# Patient Record
Sex: Female | Born: 1995 | Race: White | Hispanic: No | Marital: Single | Smoking: Current every day smoker
Health system: Southern US, Community
[De-identification: ages and names within clinical notes are randomized; demographics above are authoritative.]

## PROBLEM LIST (undated history)

## (undated) ENCOUNTER — Inpatient Hospital Stay (HOSPITAL_COMMUNITY): Payer: Self-pay

## (undated) DIAGNOSIS — F1910 Other psychoactive substance abuse, uncomplicated: Secondary | ICD-10-CM

## (undated) DIAGNOSIS — M419 Scoliosis, unspecified: Secondary | ICD-10-CM

## (undated) HISTORY — PX: BACK SURGERY: SHX140

## (undated) HISTORY — DX: Other psychoactive substance abuse, uncomplicated: F19.10

## (undated) HISTORY — PX: ADENOIDECTOMY: SUR15

---
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
## 2015-02-09 ENCOUNTER — Encounter (HOSPITAL_COMMUNITY): Payer: Self-pay | Admitting: Advanced Practice Midwife

## 2015-02-09 ENCOUNTER — Inpatient Hospital Stay (HOSPITAL_COMMUNITY)
Admission: AD | Admit: 2015-02-09 | Discharge: 2015-02-09 | Disposition: A | Discharge status: Home / Self Care | Payer: Medicaid Other | Source: Ambulatory Visit | Attending: Obstetrics and Gynecology | Admitting: Obstetrics and Gynecology

## 2015-02-09 DIAGNOSIS — O99332 Smoking (tobacco) complicating pregnancy, second trimester: Secondary | ICD-10-CM | POA: Diagnosis not present

## 2015-02-09 DIAGNOSIS — O26892 Other specified pregnancy related conditions, second trimester: Secondary | ICD-10-CM | POA: Insufficient documentation

## 2015-02-09 DIAGNOSIS — K5903 Drug induced constipation: Secondary | ICD-10-CM | POA: Diagnosis not present

## 2015-02-09 DIAGNOSIS — T402X5A Adverse effect of other opioids, initial encounter: Secondary | ICD-10-CM | POA: Diagnosis not present

## 2015-02-09 DIAGNOSIS — O99322 Drug use complicating pregnancy, second trimester: Secondary | ICD-10-CM

## 2015-02-09 DIAGNOSIS — F17210 Nicotine dependence, cigarettes, uncomplicated: Secondary | ICD-10-CM | POA: Diagnosis not present

## 2015-02-09 DIAGNOSIS — R1030 Lower abdominal pain, unspecified: Secondary | ICD-10-CM | POA: Diagnosis not present

## 2015-02-09 DIAGNOSIS — R102 Pelvic and perineal pain: Secondary | ICD-10-CM

## 2015-02-09 DIAGNOSIS — F1120 Opioid dependence, uncomplicated: Secondary | ICD-10-CM | POA: Diagnosis not present

## 2015-02-09 DIAGNOSIS — O26899 Other specified pregnancy related conditions, unspecified trimester: Secondary | ICD-10-CM

## 2015-02-09 DIAGNOSIS — O0932 Supervision of pregnancy with insufficient antenatal care, second trimester: Secondary | ICD-10-CM | POA: Insufficient documentation

## 2015-02-09 DIAGNOSIS — Z3A18 18 weeks gestation of pregnancy: Secondary | ICD-10-CM | POA: Diagnosis not present

## 2015-02-09 DIAGNOSIS — N949 Unspecified condition associated with female genital organs and menstrual cycle: Secondary | ICD-10-CM

## 2015-02-09 DIAGNOSIS — O9989 Other specified diseases and conditions complicating pregnancy, childbirth and the puerperium: Secondary | ICD-10-CM

## 2015-02-09 HISTORY — DX: Other psychoactive substance abuse, uncomplicated: F19.10

## 2015-02-09 LAB — URINALYSIS, ROUTINE W REFLEX MICROSCOPIC
Bilirubin Urine: NEGATIVE
Glucose, UA: NEGATIVE mg/dL
HGB URINE DIPSTICK: NEGATIVE
Ketones, ur: 15 mg/dL — AB
LEUKOCYTES UA: NEGATIVE
NITRITE: NEGATIVE
PROTEIN: NEGATIVE mg/dL
Specific Gravity, Urine: 1.025 (ref 1.005–1.030)
UROBILINOGEN UA: 2 mg/dL — AB (ref 0.0–1.0)
pH: 6.5 (ref 5.0–8.0)

## 2015-02-09 LAB — WET PREP, GENITAL
Clue Cells Wet Prep HPF POC: NONE SEEN
TRICH WET PREP: NONE SEEN
WBC WET PREP: NONE SEEN
YEAST WET PREP: NONE SEEN

## 2015-02-09 MED ORDER — POLYETHYLENE GLYCOL 3350 17 G PO PACK: 17.0000 g | PACK | Freq: Every day | ORAL | Status: DC | PRN

## 2015-02-09 NOTE — Progress Notes (Signed)
Marlou Porch CNM in to discuss lab results and d/c plan with pt. Written and verbal d/c instructions given and understanding voiced

## 2015-02-09 NOTE — Discharge Instructions (Signed)
Abdominal Pain During Pregnancy Abdominal pain is common in pregnancy. Most of the time, it does not cause harm. There are many causes of abdominal pain. Some causes are more serious than others. Some of the causes of abdominal pain in pregnancy are easily diagnosed. Occasionally, the diagnosis takes time to understand. Other times, the cause is not determined. Abdominal pain can be a sign that something is very wrong with the pregnancy, or the pain may have nothing to do with the pregnancy at all. For this reason, always tell your health care provider if you have any abdominal discomfort. HOME CARE INSTRUCTIONS  Monitor your abdominal pain for any changes. The following actions may help to alleviate any discomfort you are experiencing:  Do not have sexual intercourse or put anything in your vagina until your symptoms go away completely.  Get plenty of rest until your pain improves.  Drink clear fluids if you feel nauseous. Avoid solid food as long as you are uncomfortable or nauseous.  Only take over-the-counter or prescription medicine as directed by your health care provider.  Keep all follow-up appointments with your health care provider. SEEK IMMEDIATE MEDICAL CARE IF:  You are bleeding, leaking fluid, or passing tissue from the vagina.  You have increasing pain or cramping.  You have persistent vomiting.  You have painful or bloody urination.  You have a fever.  You notice a decrease in your baby's movements.  You have extreme weakness or feel faint.  You have shortness of breath, with or without abdominal pain.  You develop a severe headache with abdominal pain.  You have abnormal vaginal discharge with abdominal pain.  You have persistent diarrhea.  You have abdominal pain that continues even after rest, or gets worse. MAKE SURE YOU:   Understand these instructions.  Will watch your condition.  Will get help right away if you are not doing well or get worse.     This information is not intended to replace advice given to you by your health care provider. Make sure you discuss any questions you have with your health care provider.   Document Released: 03/20/2005 Document Revised: 01/08/2013 Document Reviewed: 10/17/2012 Elsevier Interactive Patient Education 2016 [employer].   How a Baby Grows During Pregnancy Pregnancy begins when a female's sperm enters a female's egg (fertilization). This happens in one of the tubes (fallopian tubes) that connect the ovaries to the womb (uterus). The fertilized egg is called an embryo until it reaches 10 weeks. From 10 weeks until birth, it is called a fetus. The fertilized egg moves down the fallopian tube to the uterus. Then it implants into the lining of the uterus and begins to grow. The developing fetus receives oxygen and nutrients through the pregnant woman's bloodstream and the tissues that grow (placenta) to support the fetus. The placenta is the life support system for the fetus. It provides nutrition and removes waste. Learning as much as you can about your pregnancy and how your baby is developing can help you enjoy the experience. It can also make you aware of when there might be a problem and when to ask questions. HOW LONG DOES A TYPICAL PREGNANCY LAST? A pregnancy usually lasts 280 days, or about 40 weeks. Pregnancy is divided into three trimesters:  First trimester: 0-13 weeks.  Second trimester: 14-27 weeks.  Third trimester: 28-40 weeks. The day when your baby is considered ready to be born (full term) is your estimated date of delivery. HOW DOES MY BABY DEVELOP MONTH  BY MONTH? First month  The fertilized egg attaches to the inside of the uterus.  Some cells will form the placenta. Others will form the fetus.  The arms, legs, brain, spinal cord, lungs, and heart begin to develop.  At the end of the first month, the heart begins to beat. Second month  The bones, inner ear, eyelids,  hands, and feet form.  The genitals develop.  By the end of 8 weeks, all major organs are developing. Third month  All of the internal organs are forming.  Teeth develop below the gums.  Bones and muscles begin to grow. The spine can flex.  The skin is transparent.  Fingernails and toenails begin to form.  Arms and legs continue to grow longer, and hands and feet develop.  The fetus is about 3 in (7.6 cm) long. Fourth month  The placenta is completely formed.  The external sex organs, neck, outer ear, eyebrows, eyelids, and fingernails are formed.  The fetus can hear, swallow, and move its arms and legs.  The kidneys begin to produce urine.  The skin is covered with a white waxy coating (vernix) and very fine hair (lanugo). Fifth month  The fetus moves around more and can be felt for the first time (quickening).  The fetus starts to sleep and wake up and may begin to suck its finger.  The nails grow to the end of the fingers.  The organ in the digestive system that makes bile (gallbladder) functions and helps to digest the nutrients.  If your baby is a girl, eggs are present in her ovaries. If your baby is a boy, testicles start to move down into his scrotum. Sixth month  The lungs are formed, but the fetus is not yet able to breathe.  The eyes open. The brain continues to develop.  Your baby has fingerprints and toe prints. Your baby's hair grows thicker.  At the end of the second trimester, the fetus is about 9 in (22.9 cm) long. Seventh month  The fetus kicks and stretches.  The eyes are developed enough to sense changes in light.  The hands can make a grasping motion.  The fetus responds to sound. Eighth month  All organs and body systems are fully developed and functioning.  Bones harden and taste buds develop. The fetus may hiccup.  Certain areas of the brain are still developing. The skull remains soft. Ninth month  The fetus gains about   lb (0.23 kg) each week.  The lungs are fully developed.  Patterns of sleep develop.  The fetus's head typically moves into a head-down position (vertex) in the uterus to prepare for birth. If the buttocks move into a vertex position instead, the baby is breech.  The fetus weighs 6-9 lbs (2.72-4.08 kg) and is 19-20 in (48.26-50.8 cm) long. WHAT CAN I DO TO HAVE A HEALTHY PREGNANCY AND HELP MY BABY DEVELOP? Eating and Drinking  Eat a healthy diet.  Talk with your health care provider to make sure that you are getting the nutrients that you and your baby need.  Visit www.BuildDNA.es to learn about creating a healthy diet.  Gain a healthy amount of weight during pregnancy as advised by your health care provider. This is usually 25-35 pounds. You may need to:  Gain more if you were underweight before getting pregnant or if you are pregnant with more than one baby.  Gain less if you were overweight or obese when you got pregnant. Medicines and Vitamins  Take prenatal vitamins as directed by your health care provider. These include vitamins such as folic acid, iron, calcium, and vitamin D. They are important for healthy development.  Take medicines only as directed by your health care provider. Read labels and ask a pharmacist or your health care provider whether over-the-counter medicines, supplements, and prescription drugs are safe to take during pregnancy. Activities  Be physically active as advised by your health care provider. Ask your health care provider to recommend activities that are safe for you to do, such as walking or swimming.  Do not participate in strenuous or extreme sports. Lifestyle  Do not drink alcohol.  Do not use any tobacco products, including cigarettes, chewing tobacco, or electronic cigarettes. If you need help quitting, ask your health care provider.  Do not use illegal drugs. Safety  Avoid exposure to mercury, lead, or other heavy metals. Ask  your health care provider about common sources of these heavy metals.  Avoid listeria infection during pregnancy. Follow these precautions:  Do not eat soft cheeses or deli meats.  Do not eat hot dogs unless they have been warmed up to the point of steaming, such as in the microwave oven.  Do not drink unpasteurized milk.  Avoid toxoplasmosis infection during pregnancy. Follow these precautions:  Do not change your cat's litter box, if you have a cat. Ask someone else to do this for you.  Wear gardening gloves while working in the yard. General Instructions  Keep all follow-up visits as directed by your health care provider. This is important. This includes prenatal care and screening tests.  Manage any chronic health conditions. Work closely with your health care provider to keep conditions, such as diabetes, under control. HOW DO I KNOW IF MY BABY IS DEVELOPING WELL? At each prenatal visit, your health care provider will do several different tests to check on your health and keep track of your baby's development. These include:  Fundal height.  Your health care provider will measure your growing belly from top to bottom using a tape measure.  Your health care provider will also feel your belly to determine your baby's position.  Heartbeat.  An ultrasound in the first trimester can confirm pregnancy and show a heartbeat, depending on how far along you are.  Your health care provider will check your baby's heart rate at every prenatal visit.  As you get closer to your delivery date, you may have regular fetal heart rate monitoring to make sure that your baby is not in distress.  Second trimester ultrasound.  This ultrasound checks your baby's development. It also indicates your baby's gender. WHAT SHOULD I DO IF I HAVE CONCERNS ABOUT MY BABY'S DEVELOPMENT? Always talk with your health care provider about any concerns that you may have.   This information is not intended to  replace advice given to you by your health care provider. Make sure you discuss any questions you have with your health care provider.   Document Released: 09/06/2007 Document Revised: 12/09/2014 Document Reviewed: 08/27/2013 Elsevier Interactive Patient Education [employer].

## 2015-02-09 NOTE — MAU Provider Note (Signed)
Chief Complaint: Abdominal Pain  First Provider Initiated Contact with Patient 02/09/15 1552     SUBJECTIVE HPI: Christine Ramsey is a 19 y.o. G1P0 at [redacted]w[redacted]d by LMP/10 weeks [place] who presents to Maternity Admissions reporting low abd cramping each morning x a few weeks. Hasn't not started [facility] yet. Plans to go to [place] when she gets medicaid. Needs pregnancy verification letter.  Hx polysubstance abuse. On Methadone. Involuntarily committed in September 2016 for methadone, heroin, cocaine, Benzos. Goes to [facility] clinic. States she has been compliant w/ Methadone and not using other drugs since that hospitalization. Very concerned about baby's safety and states she is motivated to stay clean.  Location: mid low abd Quality: cramping Severity: moderate pain when it occurs. No pain now. Duration: weeks Context: upon waking up Timing: constant Modifying factors: worse in the morning before methadone and w/ mvmt. Resolves after taking methadone. Associated signs and symptoms: pos for nausea, frequency (throughout pregnancy), constipation (chronic since starting Methadone). Neg for fever, chills, vomiting, dysuria, urgency, hematuria, VB, vaginal discharge, LOF.   Past Medical History  Diagnosis Date  . Scoliosis   . Drug abuse   . Scoliosis    OB History  Gravida Para Term Preterm AB SAB TAB Ectopic Multiple Living  1             # Outcome Date GA Lbr Len/2nd Weight Sex Delivery Anes PTL Lv  1 Current              Past Surgical History  Procedure Laterality Date  . Back surgery    . Adenoidectomy     Social History   Social History  . Marital Status: Single    Spouse Name: N/A  . Number of Children: N/A  . Years of Education: N/A   Occupational History  . Not on file.   Social History Main Topics  . Smoking status: Current Every Day Smoker -- 0.25 packs/day    Types: Cigarettes  . Smokeless tobacco: Never Used  . Alcohol Use: No  . Drug Use: Yes    Special: Heroin     Comment: xanax and methadone. 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 Sexual Activity: No   Other Topics Concern  . Not on file   Social History Narrative   No current facility-administered medications on file prior to encounter.   Current Outpatient Prescriptions on File Prior to Encounter  Medication Sig Dispense Refill  . acetaminophen (TYLENOL) 500 MG tablet Take 1,000 mg by mouth every 6 (six) hours as needed for moderate pain or headache (pain).     . methadone (DOLOPHINE) 10 MG/ML solution Take 55 mg by mouth daily.     . clindamycin (CLEOCIN) 150 MG capsule Take 3 capsules (450 mg total) by mouth 3 (three) times daily. 63 capsule 0  . metroNIDAZOLE (FLAGYL) 500 MG tablet Take 1 tablet (500 mg total) by mouth 2 (two) times daily. 14 tablet 0   No Known Allergies  I have reviewed the past Medical Hx, Surgical Hx, Social Hx, Allergies and Medications.   Review of Systems  Constitutional: Negative for chills and fatigue.  Gastrointestinal: Positive for nausea, abdominal pain and constipation. Negative for vomiting, diarrhea, blood in stool and abdominal distention.  Genitourinary: Positive for frequency. Negative for dysuria, urgency, hematuria, flank pain, vaginal bleeding, vaginal discharge and pelvic pain.  Musculoskeletal: Negative for back pain.    OBJECTIVE Patient Vitals for the past 24 hrs:  BP Temp Pulse  Resp Height Weight  02/09/15 1558 (!) 109/43 mmHg - 79 18 - -  02/09/15 1450 121/62 mmHg 98 F (36.7 C) 81 18 5\' 7"  (1.702 m) 128 lb 6.4 oz (58.242 kg)   Constitutional: Well-developed, well-nourished, slender female in no acute distress.  Cardiovascular: normal rate Respiratory: normal rate and effort.  GI: Abd soft, non-tender, gravid appropriate for gestational age. Pos BS x 4 MS: Extremities nontender, no edema, normal ROM. No track marks. Neurologic: Alert and oriented x 4.  GU: Neg CVAT.  PELVIC EXAM: NEFG, physiologic  discharge, no blood noted, cervix long and closed. Uterus 18-week size, no adnexal tenderness or masses. No CMT.  FHR 130 by doppler.   LAB RESULTS Results for orders placed or performed during the hospital encounter of 02/09/15 (from the past 24 hour(s))  Urinalysis, Routine w reflex microscopic (not at [facility])     Status: Abnormal   Collection Time: 02/09/15  2:55 PM  Result Value Ref Range   Color, Urine YELLOW YELLOW   APPearance HAZY (A) CLEAR   Specific Gravity, Urine 1.025 1.005 - 1.030   pH 6.5 5.0 - 8.0   Glucose, UA NEGATIVE NEGATIVE mg/dL   Hgb urine dipstick NEGATIVE NEGATIVE   Bilirubin Urine NEGATIVE NEGATIVE   Ketones, ur 15 (A) NEGATIVE mg/dL   Protein, ur NEGATIVE NEGATIVE mg/dL   Urobilinogen, UA 2.0 (H) 0.0 - 1.0 mg/dL   Nitrite NEGATIVE NEGATIVE   Leukocytes, UA NEGATIVE NEGATIVE  Wet prep, genital     Status: None   Collection Time: 02/09/15  3:27 PM  Result Value Ref Range   Yeast Wet Prep HPF POC NONE SEEN NONE SEEN   Trich, Wet Prep NONE SEEN NONE SEEN   Clue Cells Wet Prep HPF POC NONE SEEN NONE SEEN   WBC, Wet Prep HPF POC NONE SEEN NONE SEEN    IMAGING No results found.  MAU COURSE UA, Wet prep, GC/Chlamydia, drugs of abuse screen.  MDM -19 year old female 18 weeks 2 days gestation with morning low abdominal pain consistent with round ligament pain/normal discomforts of pregnancy. No evidence of preterm labor. -Constipation likely related to patient's methadone therapy. Has been using Senokot as needed. Instructed to increase fluids and may add MiraLAX as needed. -Patient with history of polysubstance abuse. States she is very motivated to comply with methadone therapy. Will check drugs of abuse screen to determine if she is still using her 1 concurrent with methadone use.  ASSESSMENT 1. Pain of round ligament affecting pregnancy, antepartum   2. Methadone maintenance treatment affecting pregnancy in second trimester ([place])   3. No prenatal care in  current pregnancy in second trimester   4. Constipation due to pain medication therapy     PLAN Discharge home in stable condition. Preterm labor Precautions. Pregnancy verification letter given. Over-the-counter prenatal vitamin. MiraLAX as needed for constipation. Neonatal abstinence syndrome nurse contact information given.     Follow-up Information    Follow up with [facility].   Why:  Start prenatal care   Contact information:   [place] [address] [phone]       Follow up with [place].   Why:  As needed in emergencies   Contact information:   [address] [phone] [place] [phone]       Medication List    STOP taking these medications        clindamycin 150 MG  capsule  Commonly known as:  CLEOCIN     metroNIDAZOLE 500 MG tablet  Commonly known as:  FLAGYL      TAKE these medications        acetaminophen 500 MG tablet  Commonly known as:  TYLENOL  Take 1,000 mg by mouth every 6 (six) hours as needed for moderate pain or headache (pain).     methadone 10 MG/ML solution  Commonly known as:  DOLOPHINE  Take 55 mg by mouth daily.     polyethylene glycol packet  Commonly known as:  MIRALAX / GLYCOLAX  Take 17 g by mouth daily as needed.     senna 8.6 MG tablet  Commonly known as:  SENOKOT  Take 2 tablets by mouth daily as needed for constipation.         [place], [place] 02/09/2015  4:23 PM

## 2015-02-09 NOTE — MAU Note (Addendum)
Every morning when i wake up i having cramping in lower abd. I take methadone and after taking that the pain is better each day. No bleeding or LOF. Have not started [facility] yet.  Plan to go to [place] and last there in March. Awaiting medicaid

## 2015-02-10 LAB — GC/CHLAMYDIA PROBE AMP (~~LOC~~) NOT AT ARMC
Chlamydia: NEGATIVE
NEISSERIA GONORRHEA: NEGATIVE

## 2015-02-11 ENCOUNTER — Encounter (HOSPITAL_COMMUNITY): Payer: Self-pay

## 2015-02-11 NOTE — Progress Notes (Signed)
Patient scheduled for NICU NAS tour on 03/18/15 at 1:30 when pregnancy is closer to viability.

## 2015-02-24 ENCOUNTER — Ambulatory Visit (INDEPENDENT_AMBULATORY_CARE_PROVIDER_SITE_OTHER): Payer: Self-pay | Admitting: Family Medicine

## 2015-02-24 ENCOUNTER — Encounter: Payer: Self-pay | Admitting: Family Medicine

## 2015-02-24 VITALS — BP 107/64 | HR 67 | Temp 97.5°F | Wt 137.9 lb

## 2015-02-24 DIAGNOSIS — F1920 Other psychoactive substance dependence, uncomplicated: Secondary | ICD-10-CM

## 2015-02-24 DIAGNOSIS — O99332 Smoking (tobacco) complicating pregnancy, second trimester: Secondary | ICD-10-CM

## 2015-02-24 DIAGNOSIS — F1120 Opioid dependence, uncomplicated: Secondary | ICD-10-CM

## 2015-02-24 DIAGNOSIS — O26899 Other specified pregnancy related conditions, unspecified trimester: Secondary | ICD-10-CM

## 2015-02-24 DIAGNOSIS — O0992 Supervision of high risk pregnancy, unspecified, second trimester: Secondary | ICD-10-CM

## 2015-02-24 DIAGNOSIS — F1190 Opioid use, unspecified, uncomplicated: Secondary | ICD-10-CM

## 2015-02-24 DIAGNOSIS — Z113 Encounter for screening for infections with a predominantly sexual mode of transmission: Secondary | ICD-10-CM

## 2015-02-24 DIAGNOSIS — J029 Acute pharyngitis, unspecified: Secondary | ICD-10-CM

## 2015-02-24 DIAGNOSIS — F1994 Other psychoactive substance use, unspecified with psychoactive substance-induced mood disorder: Secondary | ICD-10-CM

## 2015-02-24 DIAGNOSIS — O99320 Drug use complicating pregnancy, unspecified trimester: Secondary | ICD-10-CM

## 2015-02-24 DIAGNOSIS — Z6791 Unspecified blood type, Rh negative: Secondary | ICD-10-CM | POA: Insufficient documentation

## 2015-02-24 DIAGNOSIS — F332 Major depressive disorder, recurrent severe without psychotic features: Secondary | ICD-10-CM

## 2015-02-24 DIAGNOSIS — F1910 Other psychoactive substance abuse, uncomplicated: Secondary | ICD-10-CM

## 2015-02-24 DIAGNOSIS — O360120 Maternal care for anti-D [Rh] antibodies, second trimester, not applicable or unspecified: Secondary | ICD-10-CM

## 2015-02-24 DIAGNOSIS — O99322 Drug use complicating pregnancy, second trimester: Secondary | ICD-10-CM

## 2015-02-24 DIAGNOSIS — O0990 Supervision of high risk pregnancy, unspecified, unspecified trimester: Secondary | ICD-10-CM | POA: Insufficient documentation

## 2015-02-24 DIAGNOSIS — O99330 Smoking (tobacco) complicating pregnancy, unspecified trimester: Secondary | ICD-10-CM | POA: Insufficient documentation

## 2015-02-24 HISTORY — DX: Opioid dependence, uncomplicated: F11.20

## 2015-02-24 LAB — POCT URINALYSIS DIP (DEVICE)
BILIRUBIN URINE: NEGATIVE
Glucose, UA: NEGATIVE mg/dL
Ketones, ur: NEGATIVE mg/dL
LEUKOCYTES UA: NEGATIVE
Nitrite: NEGATIVE
Protein, ur: NEGATIVE mg/dL
SPECIFIC GRAVITY, URINE: 1.02 (ref 1.005–1.030)
UROBILINOGEN UA: 4 mg/dL — AB (ref 0.0–1.0)
pH: 6.5 (ref 5.0–8.0)

## 2015-02-24 MED ORDER — NICOTINE 7 MG/24HR TD PT24: 7.0000 mg | MEDICATED_PATCH | Freq: Every day | TRANSDERMAL | Status: DC

## 2015-02-24 MED ORDER — AMOXICILLIN 500 MG PO CAPS: 500.0000 mg | ORAL_CAPSULE | Freq: Three times a day (TID) | ORAL | Status: DC

## 2015-02-24 MED ORDER — AMOXICILLIN 500 MG PO CAPS: 500.0000 mg | ORAL_CAPSULE | Freq: Two times a day (BID) | ORAL | Status: AC

## 2015-02-24 MED ORDER — NICOTINE POLACRILEX 2 MG MT LOZG: 2.0000 mg | LOZENGE | OROMUCOSAL | Status: DC | PRN

## 2015-02-24 NOTE — Patient Instructions (Signed)

## 2015-02-24 NOTE — Progress Notes (Signed)
Subjective:  Christine Ramsey is a 19 y.o. G1P0000 at [redacted]w[redacted]d being seen today for ongoing prenatal care.  She is currently monitored for the following issues for this high-risk pregnancy and has Chronic back pain; GAD (generalized anxiety disorder); Parent-child relational problem; Idiopathic scoliosis s/p [place] 2013 [place]; MDD (major depressive disorder), recurrent severe, without psychosis ([place]); Polysubstance (including opioids) dependence with physiological dependence ([place]); Substance induced mood disorder ([place]); Suicidal ideations; Aggressive behavior; Polysubstance abuse; Supervision of high risk pregnancy, antepartum; Methadone maintenance treatment affecting pregnancy ([place]); and Tobacco smoking affecting pregnancy, antepartum on her problem list.  Patient reports no pregnancy complaints but does have sore throat.  Contractions: Not present.  .  Movement: Present. Denies leaking of fluid.   Sore throat: present for 3 days, reports it hurts to swallow. She reports a fever by history but did not check temp. Reports a mild cough. Reports swollen lymch nodes. Brother strep POS. She has been taking tylenol which helps but did does hurt to eat.   The following portions of the patient's history were reviewed and updated as appropriate: allergies, current medications, past family history, past medical history, past social history, past surgical history and problem list. Problem list updated.  Objective:   Filed Vitals:   02/24/15 1249  BP: 107/64  Pulse: 67  Temp: 97.5 F (36.4 C)  Weight: 137 lb 14.4 oz (62.551 kg)    Fetal Status: Fetal Heart Rate (bpm): 130   Movement: Present     General:  Alert, oriented and cooperative. Patient is in no acute distress.  Skin: Skin is warm and dry. No rash noted.   Cardiovascular: Normal heart rate noted  Respiratory: Normal respiratory effort, no problems with respiration noted  Abdomen: Soft, gravid, appropriate for gestational age.  Pain/Pressure: Absent     Pelvic:       Cervical exam deferred        Extremities: Normal range of motion.  Edema: None  Mental Status: Normal mood and affect. Normal behavior. Normal judgment and thought content.   Throat: erythematous posterior OP with enlarged tonsils, +exudates. + cervical LAD  Urinalysis:      Assessment and Plan:  Pregnancy: G1P0000 at [redacted]w[redacted]d  1. Drug use complicating pregnancy in second trimester 2. Methadone use (HCC) - Prescript Monitor Profile(19)  3. MDD (major depressive disorder), recurrent severe, without psychosis ([place]) -Needs screening q trimester and involvment with SW and Pysch  4. Polysubstance (including opioids) dependence with physiological dependence ([place])  5. Supervision of high risk pregnancy, antepartum, second trimester - added pregnancy box - Prenatal Profile - Cystic fibrosis diagnostic study - Culture, OB Urine - Prescript Monitor Profile(19) - AFP, Quad Screen - [place] MFM OB COMP + 14 WK; Future  6. Suspected Strep throat - patient with 3 centor criteria - Group A strep collected - Amoxicillin sent to pharmacy  Preterm labor symptoms and general obstetric precautions including but not limited to vaginal bleeding, contractions, leaking of fluid and fetal movement were reviewed in detail with the patient. Please refer to After Visit Summary for other counseling recommendations.  Return in about 4 weeks (around 03/24/2015) for Routine prenatal care- needs to see SW.   Caren Macadam, MD

## 2015-02-25 LAB — RAPID STREP SCREEN (MED CTR MEBANE ONLY): Streptococcus, Group A Screen (Direct): NEGATIVE

## 2015-02-25 LAB — GC/CHLAMYDIA PROBE AMP (~~LOC~~) NOT AT ARMC
CHLAMYDIA, DNA PROBE: NEGATIVE
NEISSERIA GONORRHEA: NEGATIVE

## 2015-02-25 LAB — HEPATITIS C ANTIBODY: HCV Ab: NEGATIVE

## 2015-02-26 LAB — PRENATAL PROFILE (SOLSTAS)
ANTIBODY SCREEN: POSITIVE — AB
Basophils Absolute: 0 10*3/uL (ref 0.0–0.1)
Basophils Relative: 0 % (ref 0–1)
EOS PCT: 1 % (ref 0–5)
Eosinophils Absolute: 0.1 10*3/uL (ref 0.0–0.7)
HEMATOCRIT: 33.1 % — AB (ref 36.0–46.0)
HEMOGLOBIN: 11.5 g/dL — AB (ref 12.0–15.0)
HIV 1&2 Ab, 4th Generation: NONREACTIVE
Hepatitis B Surface Ag: NEGATIVE
LYMPHS ABS: 2.2 10*3/uL (ref 0.7–4.0)
LYMPHS PCT: 23 % (ref 12–46)
MCH: 30.9 pg (ref 26.0–34.0)
MCHC: 34.7 g/dL (ref 30.0–36.0)
MCV: 89 fL (ref 78.0–100.0)
MONO ABS: 0.6 10*3/uL (ref 0.1–1.0)
MPV: 9.6 fL (ref 8.6–12.4)
Monocytes Relative: 6 % (ref 3–12)
Neutro Abs: 6.6 10*3/uL (ref 1.7–7.7)
Neutrophils Relative %: 70 % (ref 43–77)
Platelets: 238 10*3/uL (ref 150–400)
RBC: 3.72 MIL/uL — ABNORMAL LOW (ref 3.87–5.11)
RDW: 13.6 % (ref 11.5–15.5)
RUBELLA: 3.83 {index} — AB (ref <0.90)
Rh Type: NEGATIVE
WBC: 9.4 10*3/uL (ref 4.0–10.5)

## 2015-02-26 LAB — PRENATAL ANTIBODY IDENTIFICATION

## 2015-02-26 LAB — CULTURE, OB URINE
COLONY COUNT: NO GROWTH
Organism ID, Bacteria: NO GROWTH

## 2015-02-26 LAB — ANTIBODY TITER (PRENATAL TITER): Ab Titer: <2

## 2015-02-27 LAB — CULTURE, GROUP A STREP: Organism ID, Bacteria: NO GROWTH

## 2015-03-02 LAB — CYSTIC FIBROSIS DIAGNOSTIC STUDY

## 2015-03-02 LAB — BENZODIAZEPINES (GC/LC/MS), URINE
ALPRAZOLAMU: 120 ng/mL — AB (ref <25)
Clonazepam metabolite (GC/LC/MS), ur confirm: 40 ng/mL — AB (ref <25)
Flurazepam metabolite (GC/LC/MS), ur confirm: NEGATIVE ng/mL (ref <50)
Lorazepam (GC/LC/MS), ur confirm: NEGATIVE ng/mL (ref <50)
MIDAZOLAMU: NEGATIVE ng/mL (ref <50)
NORDIAZEPAMU: NEGATIVE ng/mL (ref <50)
Oxazepam (GC/LC/MS), ur confirm: NEGATIVE ng/mL (ref <50)
TRIAZOLAMU: NEGATIVE ng/mL (ref <50)
Temazepam (GC/LC/MS), ur confirm: NEGATIVE ng/mL (ref <50)

## 2015-03-02 LAB — METHADONE (GC/LC/MS), URINE
EDDPUC: 24491 ng/mL — AB (ref <100)
METHADONEUC: 4582 ng/mL — AB (ref <100)

## 2015-03-02 LAB — CANNABANOIDS (GC/LC/MS), URINE: THC-COOH UR CONFIRM: 21 ng/mL — AB (ref <5)

## 2015-03-03 ENCOUNTER — Other Ambulatory Visit: Payer: Self-pay | Admitting: Family Medicine

## 2015-03-03 ENCOUNTER — Ambulatory Visit (HOSPITAL_COMMUNITY)
Admission: RE | Admit: 2015-03-03 | Discharge: 2015-03-03 | Disposition: A | Discharge status: Home / Self Care | Payer: Medicaid Other | Source: Ambulatory Visit | Attending: Family Medicine | Admitting: Family Medicine

## 2015-03-03 DIAGNOSIS — O360190 Maternal care for anti-D [Rh] antibodies, unspecified trimester, not applicable or unspecified: Secondary | ICD-10-CM

## 2015-03-03 DIAGNOSIS — O99322 Drug use complicating pregnancy, second trimester: Secondary | ICD-10-CM | POA: Diagnosis not present

## 2015-03-03 DIAGNOSIS — F1110 Opioid abuse, uncomplicated: Secondary | ICD-10-CM | POA: Insufficient documentation

## 2015-03-03 DIAGNOSIS — O99342 Other mental disorders complicating pregnancy, second trimester: Secondary | ICD-10-CM | POA: Insufficient documentation

## 2015-03-03 DIAGNOSIS — O99340 Other mental disorders complicating pregnancy, unspecified trimester: Secondary | ICD-10-CM

## 2015-03-03 DIAGNOSIS — O99320 Drug use complicating pregnancy, unspecified trimester: Secondary | ICD-10-CM

## 2015-03-03 DIAGNOSIS — Z87798 Personal history of other (corrected) congenital malformations: Secondary | ICD-10-CM

## 2015-03-03 DIAGNOSIS — O26892 Other specified pregnancy related conditions, second trimester: Secondary | ICD-10-CM | POA: Diagnosis not present

## 2015-03-03 DIAGNOSIS — O99332 Smoking (tobacco) complicating pregnancy, second trimester: Secondary | ICD-10-CM | POA: Insufficient documentation

## 2015-03-03 DIAGNOSIS — Z36 Encounter for antenatal screening of mother: Secondary | ICD-10-CM | POA: Diagnosis not present

## 2015-03-03 DIAGNOSIS — Z3A21 21 weeks gestation of pregnancy: Secondary | ICD-10-CM

## 2015-03-03 DIAGNOSIS — F1120 Opioid dependence, uncomplicated: Secondary | ICD-10-CM

## 2015-03-03 DIAGNOSIS — Z6791 Unspecified blood type, Rh negative: Secondary | ICD-10-CM | POA: Insufficient documentation

## 2015-03-03 DIAGNOSIS — F1190 Opioid use, unspecified, uncomplicated: Secondary | ICD-10-CM

## 2015-03-03 DIAGNOSIS — O0992 Supervision of high risk pregnancy, unspecified, second trimester: Secondary | ICD-10-CM

## 2015-03-03 LAB — PRESCRIPTION MONITORING PROFILE (19 PANEL)
AMPHETAMINE/METH: NEGATIVE ng/mL
BARBITURATE SCREEN, URINE: NEGATIVE ng/mL
Buprenorphine, Urine: NEGATIVE ng/mL
CARISOPRODOL, URINE: NEGATIVE ng/mL
COCAINE METABOLITES: NEGATIVE ng/mL
CREATININE, URINE: 132.93 mg/dL (ref >20.0)
Fentanyl, Ur: NEGATIVE ng/mL
MDMA URINE: NEGATIVE ng/mL
Meperidine, Ur: NEGATIVE ng/mL
Methaqualone: NEGATIVE ng/mL
Nitrites, Initial: NEGATIVE ug/mL
OPIATE SCREEN, URINE: NEGATIVE ng/mL
OXYCODONE SCRN UR: NEGATIVE ng/mL
PH URINE, INITIAL: 6.2 pH (ref 4.5–8.9)
PHENCYCLIDINE, UR: NEGATIVE ng/mL
Propoxyphene: NEGATIVE ng/mL
TAPENTADOLUR: NEGATIVE ng/mL
Tramadol Scrn, Ur: NEGATIVE ng/mL
ZOLPIDEM, URINE: NEGATIVE ng/mL

## 2015-03-03 LAB — AFP, QUAD SCREEN
AFP: 67.1 ng/mL
Age Alone: 1:1180 {titer}
Curr Gest Age: 20.5 wks.days
Down Syndrome Scr Risk Est: 1:38500 {titer}
HCG, Total: 7.48 IU/mL
INH: 221.2 pg/mL
Interpretation-AFP: NEGATIVE
MOM FOR AFP: 1
MoM for INH: 1.02
MoM for hCG: 0.33
Open Spina bifida: NEGATIVE
TRI 18 SCR RISK EST: NEGATIVE
UE3 VALUE: 1.62 ng/mL
uE3 Mom: 0.8

## 2015-03-19 ENCOUNTER — Encounter (HOSPITAL_COMMUNITY): Payer: Self-pay

## 2015-03-19 NOTE — Progress Notes (Signed)
Patient did not show for scheduled NICU NAS tour. Please have patient call [phone] to reschedule.

## 2015-03-24 ENCOUNTER — Encounter: Payer: Self-pay | Admitting: Advanced Practice Midwife

## 2015-03-31 ENCOUNTER — Ambulatory Visit (HOSPITAL_COMMUNITY): Payer: Self-pay

## 2015-03-31 ENCOUNTER — Ambulatory Visit (HOSPITAL_COMMUNITY)
Admission: RE | Admit: 2015-03-31 | Discharge: 2015-03-31 | Disposition: A | Discharge status: Home / Self Care | Payer: Medicaid Other | Source: Ambulatory Visit | Attending: Family Medicine | Admitting: Family Medicine

## 2015-03-31 ENCOUNTER — Other Ambulatory Visit (HOSPITAL_COMMUNITY): Payer: Self-pay | Admitting: Obstetrics and Gynecology

## 2015-03-31 DIAGNOSIS — O99342 Other mental disorders complicating pregnancy, second trimester: Secondary | ICD-10-CM

## 2015-03-31 DIAGNOSIS — Z87798 Personal history of other (corrected) congenital malformations: Secondary | ICD-10-CM

## 2015-03-31 DIAGNOSIS — F1120 Opioid dependence, uncomplicated: Secondary | ICD-10-CM

## 2015-03-31 DIAGNOSIS — F1110 Opioid abuse, uncomplicated: Secondary | ICD-10-CM | POA: Insufficient documentation

## 2015-03-31 DIAGNOSIS — O26893 Other specified pregnancy related conditions, third trimester: Secondary | ICD-10-CM | POA: Insufficient documentation

## 2015-03-31 DIAGNOSIS — O99332 Smoking (tobacco) complicating pregnancy, second trimester: Secondary | ICD-10-CM

## 2015-03-31 DIAGNOSIS — Z3A25 25 weeks gestation of pregnancy: Secondary | ICD-10-CM | POA: Insufficient documentation

## 2015-03-31 DIAGNOSIS — O360120 Maternal care for anti-D [Rh] antibodies, second trimester, not applicable or unspecified: Secondary | ICD-10-CM

## 2015-03-31 DIAGNOSIS — O99322 Drug use complicating pregnancy, second trimester: Secondary | ICD-10-CM | POA: Diagnosis not present

## 2015-03-31 DIAGNOSIS — Z6791 Unspecified blood type, Rh negative: Secondary | ICD-10-CM | POA: Insufficient documentation

## 2015-03-31 DIAGNOSIS — O358XX0 Maternal care for other (suspected) fetal abnormality and damage, not applicable or unspecified: Secondary | ICD-10-CM | POA: Insufficient documentation

## 2015-03-31 DIAGNOSIS — O99320 Drug use complicating pregnancy, unspecified trimester: Secondary | ICD-10-CM

## 2015-04-04 NOTE — L&D Delivery Note (Signed)
Delivery Note At 11:44 AM a viable female was delivered via  (Presentation:vertex ; LOA ).  APGAR:8 ,9 ; weight  .   Placenta status:spont ,3vc .  Cord:3vc  with the following complications:none .  Cord pH: n/a  Anesthesia:epidural   Episiotomy:  none Lacerations: 1 degree left labial  Suture Repair: 3.0 chromic Est. Blood Loss 150(mL):    Mom to postpartum.  Baby to Couplet care / Skin to Skin.  Christine Ramsey DARLENE 06/27/2015, 12:01 PM

## 2015-05-12 ENCOUNTER — Encounter (HOSPITAL_COMMUNITY): Payer: Self-pay

## 2015-05-12 ENCOUNTER — Ambulatory Visit (HOSPITAL_COMMUNITY)
Admission: RE | Admit: 2015-05-12 | Discharge: 2015-05-12 | Disposition: A | Discharge status: Home / Self Care | Payer: Medicaid Other | Source: Ambulatory Visit | Attending: Family Medicine | Admitting: Family Medicine

## 2015-05-12 DIAGNOSIS — Z6791 Unspecified blood type, Rh negative: Secondary | ICD-10-CM | POA: Insufficient documentation

## 2015-05-12 DIAGNOSIS — O358XX0 Maternal care for other (suspected) fetal abnormality and damage, not applicable or unspecified: Secondary | ICD-10-CM | POA: Insufficient documentation

## 2015-05-12 DIAGNOSIS — O26893 Other specified pregnancy related conditions, third trimester: Secondary | ICD-10-CM | POA: Diagnosis not present

## 2015-05-12 DIAGNOSIS — O99320 Drug use complicating pregnancy, unspecified trimester: Secondary | ICD-10-CM

## 2015-05-12 DIAGNOSIS — O99343 Other mental disorders complicating pregnancy, third trimester: Secondary | ICD-10-CM | POA: Insufficient documentation

## 2015-05-12 DIAGNOSIS — F1110 Opioid abuse, uncomplicated: Secondary | ICD-10-CM | POA: Insufficient documentation

## 2015-05-12 DIAGNOSIS — O99323 Drug use complicating pregnancy, third trimester: Secondary | ICD-10-CM | POA: Insufficient documentation

## 2015-05-12 DIAGNOSIS — Z3A31 31 weeks gestation of pregnancy: Secondary | ICD-10-CM | POA: Diagnosis not present

## 2015-05-12 DIAGNOSIS — F329 Major depressive disorder, single episode, unspecified: Secondary | ICD-10-CM | POA: Insufficient documentation

## 2015-05-12 DIAGNOSIS — F1120 Opioid dependence, uncomplicated: Secondary | ICD-10-CM

## 2015-05-18 ENCOUNTER — Ambulatory Visit (INDEPENDENT_AMBULATORY_CARE_PROVIDER_SITE_OTHER): Payer: Medicaid Other | Admitting: Advanced Practice Midwife

## 2015-05-18 VITALS — BP 112/58 | HR 78 | Temp 98.4°F | Wt 163.7 lb

## 2015-05-18 DIAGNOSIS — O99333 Smoking (tobacco) complicating pregnancy, third trimester: Secondary | ICD-10-CM | POA: Diagnosis not present

## 2015-05-18 DIAGNOSIS — F411 Generalized anxiety disorder: Secondary | ICD-10-CM | POA: Diagnosis not present

## 2015-05-18 DIAGNOSIS — O0993 Supervision of high risk pregnancy, unspecified, third trimester: Secondary | ICD-10-CM

## 2015-05-18 DIAGNOSIS — Z23 Encounter for immunization: Secondary | ICD-10-CM | POA: Diagnosis not present

## 2015-05-18 DIAGNOSIS — F1190 Opioid use, unspecified, uncomplicated: Secondary | ICD-10-CM | POA: Diagnosis not present

## 2015-05-18 DIAGNOSIS — M4120 Other idiopathic scoliosis, site unspecified: Secondary | ICD-10-CM

## 2015-05-18 DIAGNOSIS — O409XX0 Polyhydramnios, unspecified trimester, not applicable or unspecified: Secondary | ICD-10-CM

## 2015-05-18 DIAGNOSIS — O99323 Drug use complicating pregnancy, third trimester: Secondary | ICD-10-CM | POA: Diagnosis not present

## 2015-05-18 DIAGNOSIS — F17210 Nicotine dependence, cigarettes, uncomplicated: Secondary | ICD-10-CM

## 2015-05-18 DIAGNOSIS — O99343 Other mental disorders complicating pregnancy, third trimester: Secondary | ICD-10-CM

## 2015-05-18 DIAGNOSIS — O289 Unspecified abnormal findings on antenatal screening of mother: Secondary | ICD-10-CM

## 2015-05-18 DIAGNOSIS — F1990 Other psychoactive substance use, unspecified, uncomplicated: Secondary | ICD-10-CM

## 2015-05-18 LAB — CBC
HCT: 36.9 % (ref 36.0–46.0)
Hemoglobin: 12.4 g/dL (ref 12.0–15.0)
MCH: 29.7 pg (ref 26.0–34.0)
MCHC: 33.6 g/dL (ref 30.0–36.0)
MCV: 88.3 fL (ref 78.0–100.0)
MPV: 9.2 fL (ref 8.6–12.4)
PLATELETS: 269 10*3/uL (ref 150–400)
RBC: 4.18 MIL/uL (ref 3.87–5.11)
RDW: 13.8 % (ref 11.5–15.5)
WBC: 10.5 10*3/uL (ref 4.0–10.5)

## 2015-05-18 LAB — POCT URINALYSIS DIP (DEVICE)
Bilirubin Urine: NEGATIVE
Glucose, UA: NEGATIVE mg/dL
HGB URINE DIPSTICK: NEGATIVE
Ketones, ur: NEGATIVE mg/dL
NITRITE: NEGATIVE
Protein, ur: NEGATIVE mg/dL
Specific Gravity, Urine: 1.015 (ref 1.005–1.030)
Urobilinogen, UA: 1 mg/dL (ref 0.0–1.0)
pH: 7 (ref 5.0–8.0)

## 2015-05-18 MED ORDER — TETANUS-DIPHTH-ACELL PERTUSSIS 5-2.5-18.5 LF-MCG/0.5 IM SUSP
0.5000 mL | Freq: Once | INTRAMUSCULAR | Status: AC
  Administered 2015-05-18: 0.5 mL via INTRAMUSCULAR

## 2015-05-18 NOTE — Progress Notes (Signed)
AFI today is wnl - 17.7 cm.  Appt @ MFM for AFI tomorrow cancelled.

## 2015-05-18 NOTE — Progress Notes (Signed)
Subjective:  Christine Ramsey is a 20 y.o. G1P0000 at [redacted]w[redacted]d being seen today for ongoing prenatal care.  She is currently monitored for the following issues for this high-risk pregnancy and has Chronic back pain; GAD (generalized anxiety disorder); Idiopathic scoliosis s/p Harrington Rod 2013 [place]; MDD (major depressive disorder), recurrent severe, without psychosis ([place]); Substance induced mood disorder ([place]); Polysubstance abuse; Supervision of high risk pregnancy, antepartum; Methadone maintenance treatment affecting pregnancy ([place]); Tobacco smoking affecting pregnancy, antepartum; and Rh negative, antepartum on her problem list.  Patient reports occasional rectal pressure, concerns about her harrington rods and possibilty of epidural in labor.  Contractions: Not present. Vag. Bleeding: None.  Movement: Present. Denies leaking of fluid.   The following portions of the patient's history were reviewed and updated as appropriate: allergies, current medications, past family history, past medical history, past social history, past surgical history and problem list. Problem list updated.  Objective:   Filed Vitals:   05/18/15 1040  BP: 112/58  Pulse: 78  Temp: 98.4 F (36.9 C)  Weight: 163 lb 11.2 oz (74.254 kg)    Fetal Status: Fetal Heart Rate (bpm): 122 Fundal Height: 32 cm Movement: Present     General:  Alert, oriented and cooperative. Patient is in no acute distress.  Skin: Skin is warm and dry. No rash noted.   Cardiovascular: Normal heart rate noted  Respiratory: Normal respiratory effort, no problems with respiration noted  Abdomen: Soft, gravid, appropriate for gestational age. Pain/Pressure: Present     Pelvic: Vag. Bleeding: None Vag D/C Character: White   Cervical exam deferred        Extremities: Normal range of motion.  Edema: Trace  Mental Status: Normal mood and affect. Normal behavior. Normal judgment and thought content.   Urinalysis: Urine Protein: Negative Urine  Glucose: Negative  Assessment and Plan:  Pregnancy: G1P0000 at [redacted]w[redacted]d  1. Supervision of high risk pregnancy in third trimester  - Glucose Tolerance, 1 HR (50g) w/o Fasting - CBC - RPR - HIV antibody (with reflex)  2. Amniotic fluid index increased  - Amniotic fluid index wnl today at 17.7. Per MFM, routine follow up.   3. Idiopathic scoliosis s/p Harrington Rod 2013 [facility] --Consult anesthesia, will try epidural per Dr Clide Cliff but may not work.  Pt aware.  Recommend childbirth classes/doula. Discussed pain management options with pt.    Preterm labor symptoms and general obstetric precautions including but not limited to vaginal bleeding, contractions, leaking of fluid and fetal movement were reviewed in detail with the patient. Please refer to After Visit Summary for other counseling recommendations.  Return in about 2 weeks (around 06/01/2015).   Elvera Maria, CNM

## 2015-05-18 NOTE — Patient Instructions (Signed)
Recommend registering for childbirth classes on [email or web address] and discussed using doula for labor support.   Natural Childbirth Natural childbirth is going through labor and delivery without any drugs to relieve pain. You also do not use fetal monitors, have a cesarean delivery, or get a sugical cut to enlarge the vaginal opening (episiotomy). With the help of a birthing professional (midwife), you will direct your own labor and delivery as you choose. Many women chose natural childbirth because they feel more in control and in touch with their labor and delivery. They are also concerned about the medications affecting themselves and the baby. Pregnant women with a high risk pregnancy should not attempt natural childbirth. It is better to deliver the infant in a hospital if an emergency situation arises. Sometimes, the caregiver has to intervene for the health and safety of the mother and infant. TWO TECHNIQUES FOR NATURAL CHILDBIRTH:   The [place] method. This method teaches women that having a baby is normal, healthy, and natural. It also teaches the mother to take a neutral position regarding pain medication and anesthesia and to make an informed decision if and when it is right for them.  The [place] Fray (also called husband coached birth). This method teaches the father to be the birth coach and stresses a natural approach. It also encourages exercise and a balanced diet with good nutrition. The exercises teach relaxation and deep breathing techniques. However, there are also classes to prepare the parents for an emergency situation that may occur. METHODS OF DEALING WITH LABOR PAIN AND DELIVERY: 1. Meditation. 2. Yoga. 3. Hypnosis. 4. Acupuncture. 5. Massage. 6. Changing positions (walking, rocking, showering, leaning on birth balls). 7. Lying in warm water or a jacuzzi. 8. Find an activity that keeps your mind off of the labor pain. 9. Listen to soft music. 10. Visual imagery (focus on  a particular object). BEFORE GOING INTO LABOR  Be sure you and your spouse/partner are in agreement to have natural childbirth.  Decide if your caregiver or a midwife will deliver your baby.  Decide if you will have your baby in the hospital, birthing center, or at home.  If you have children, make plans to have someone to take care of them when you go to the hospital.  Know the distance and the time it takes to go to the delivery center. Make a dry run to be sure.  Have a bag packed with a night gown, bathrobe, and toiletries ready to take when you go into labor.  Keep phone numbers of your family and friends handy if you need to call someone when you go into labor.  Your spouse or partner should go to all the teaching classes.  Talk with your caregiver about the possibility of a medical emergency and what will happen if that occurs. ADVANTAGES OF NATURAL CHILDBIRTH  You are in control of your labor and delivery.  It is safe.  There are no medications or anesthetics that may affect you and the fetus.  There are no invasive procedures such as an episiotomy.  You and your partner will work together, which can increase your bond.  Meditation, yoga, massage, and breathing exercises can be learned while pregnant and help you when you are in labor and at delivery.  In most delivery centers, the family and friends can be involved in the labor and delivery process. DISADVANTAGES OF NATURAL CHILDBIRTH  You will experience pain during your labor and delivery.  The methods of helping relieve your labor  pains may not work for you.  You may feel embarrassed, disappointed, and like a failure if you decide to change your mind during labor and not have natural childbirth. AFTER THE DELIVERY  You will be very tired.  You will be uncomfortable because of your uterus contracting. You will feel soreness around the vagina.  You may feel cold and shaky.This is a natural reaction.  You  will be excited, overwhelmed, accomplished, and proud to be a mother. HOME CARE INSTRUCTIONS   Follow the advice and instructions of your caregiver.  Follow the instructions of your natural childbirth instructor ([place] or [place]).   This information is not intended to replace advice given to you by your health care provider. Make sure you discuss any questions you have with your health care provider.   Document Released: 03/02/2008 Document Revised: 06/12/2011 Document Reviewed: 11/25/2012 Elsevier Interactive Patient Education 2016 [place].  Pain Relief During Labor and Delivery Everyone experiences pain differently, but labor causes severe pain for many women. The amount of pain you experience during labor and delivery depends on your pain tolerance, contraction strength, and your baby's size and position. There are many ways to prepare for and deal with the pain, including:   Taking prenatal classes to learn about labor and delivery. The more informed you are, the less anxious and afraid you may be. This can help lessen the pain.  Taking pain-relieving medicine during labor and delivery.  Learning breathing and relaxation techniques.  Taking a shower or bath.  Getting massaged.  Changing positions.  Placing an ice pack on your back. Discuss your pain control options with your health care provider during your prenatal visits.  WHAT ARE THE TWO TYPES OF PAIN-RELIEVING MEDICINES? 11. Analgesics. These are medicines that decrease pain without total loss of feeling or muscle movement. 12. Anesthetics. These are medicines that block all feeling, including pain. There can be minor side effects of both types, such as nausea, trouble concentrating, becoming sleepy, and lowering the heart rate of the baby. However, health care providers are careful to give doses that will not seriously affect the baby.  WHAT ARE THE SPECIFIC TYPES OF ANALGESICS AND ANESTHETICS? Systemic  Analgesic Systemic pain medicines affect your whole body rather than focusing pain relief on the area of your body experiencing pain. This type of medicine is given either through an IV tube in your vein or by a shot (injection) into your muscle. This medicine will lessen your pain but will not stop it completely. It may also make you sleepy, but it will not make you lose consciousness.  Local Anesthetic Local anesthetic isused tonumb a small area of your body. The medicine is injected into the area of nerves that carry feeling to the vagina, vulva, or the area between the vagina and anus (perineum).  General Anesthetic This type of medicine causes you to lose consciousness so you do not feel pain. It is usually used only in emergency situations during labor. It is given through an IV tube or face mask. Paracervical Block A paracervical block is a form of local anesthesia given during labor. Numbing medicine is injected into the right and left sides of the cervix and vagina. It helps to lessen the pain caused by contractions and stretching of the cervix. It may have to be given more than once.  Pudendal Block A pudendal block is another form of local anesthesia. It is used to relieve the pain associated with pushing or stretching of the  perineum at the time of delivery. An injection is given deep through the vaginal wall into the pudendal nerve in the pelvis, numbing the perineum.  Epidural Anesthetic An epidural is an injection of numbing medicine given in the lower back and into the epidural space near your spinal cord. The epidural numbs the lower half of your body. You may be able to move your legs but will not be allowed to walk. Epidurals can be used for labor, delivery, or cesarean deliveries.  To prevent the medicine from wearing off, a small tube (catheter) may be threaded into the epidural space and taped in place to prevent it from slipping out. Medicine can then be given continuously in  small doses through the tube until you deliver. Spinal Block A spinal block is similar to an epidural, but the medicine is injected into the spinal fluid, not the epidural space. A spinal block is only given once. It starts to relieve pain quickly but lasts only 1-2 hours. Spinal blocks can also be used for cesarean deliveries.  Combined Spinal-Epidural Block Combined spinal-epidural blocks combine the benefits of both the spinal and epidural blocks. The spinal part acts quickly to relieve pain and the epidural provides continuous pain relief. Hydrotherapy Immersion in warm water during labor may provide comfort and relaxation. It may also help to lessen pain, the use of anesthesia, and the length of labor. However, immersion in water during the delivery (water birth) may have some risk involved and studies to determine safety and risks are ongoing. If you are a healthy woman who is expecting an uncomplicated birth, talk with your health care provider to see if water birth is an option for you.    This information is not intended to replace advice given to you by your health care provider. Make sure you discuss any questions you have with your health care provider.   Document Released: 07/06/2008 Document Revised: 03/25/2013 Document Reviewed: 08/08/2012 Elsevier Interactive Patient Education [employer].

## 2015-05-18 NOTE — Progress Notes (Signed)
C/o rectal pressure.  Called NICU to schedule NAS tour. Left message for Nira Conn, RN to call patient with appointment.  Called Preop nurse Dawn to inquire about anesthesia consult due to scoliosis and rod placement. In back.   They willl discuss with anesthesia and call us and patient back.  Called back and reccommended talk with Dr. Jillyn Hidden- which I did.  He stated after review of her operative notes that to place an epidural could be difficult ; but not impossible and may not be effective but they will try when she is in labor. Explained this to patient and she has discussed other pain relief options with Lattie Haw, CNM today.

## 2015-05-19 ENCOUNTER — Ambulatory Visit (HOSPITAL_COMMUNITY): Payer: Medicaid Other

## 2015-05-19 LAB — HIV ANTIBODY (ROUTINE TESTING W REFLEX): HIV 1&2 Ab, 4th Generation: NONREACTIVE

## 2015-05-19 LAB — GLUCOSE TOLERANCE, 1 HOUR (50G) W/O FASTING: GLUCOSE 1 HOUR GTT: 75 mg/dL (ref 70–140)

## 2015-05-19 LAB — RPR

## 2015-05-27 ENCOUNTER — Encounter (HOSPITAL_COMMUNITY): Payer: Self-pay

## 2015-05-27 NOTE — Progress Notes (Signed)
NICU NAS tour scheduled for 06/03/15 at 2:30 prior to Christine Ramsey's next OB appointment.

## 2015-06-03 ENCOUNTER — Ambulatory Visit (INDEPENDENT_AMBULATORY_CARE_PROVIDER_SITE_OTHER): Payer: Medicaid Other | Admitting: Advanced Practice Midwife

## 2015-06-03 ENCOUNTER — Encounter: Payer: Self-pay | Admitting: Advanced Practice Midwife

## 2015-06-03 VITALS — BP 119/55 | HR 72 | Wt 171.4 lb

## 2015-06-03 DIAGNOSIS — F1120 Opioid dependence, uncomplicated: Secondary | ICD-10-CM | POA: Diagnosis not present

## 2015-06-03 DIAGNOSIS — O99333 Smoking (tobacco) complicating pregnancy, third trimester: Secondary | ICD-10-CM

## 2015-06-03 DIAGNOSIS — O99323 Drug use complicating pregnancy, third trimester: Secondary | ICD-10-CM | POA: Diagnosis not present

## 2015-06-03 DIAGNOSIS — O99320 Drug use complicating pregnancy, unspecified trimester: Principal | ICD-10-CM

## 2015-06-03 DIAGNOSIS — F17210 Nicotine dependence, cigarettes, uncomplicated: Secondary | ICD-10-CM | POA: Diagnosis not present

## 2015-06-03 LAB — POCT URINALYSIS DIP (DEVICE)
BILIRUBIN URINE: NEGATIVE
GLUCOSE, UA: NEGATIVE mg/dL
Hgb urine dipstick: NEGATIVE
LEUKOCYTES UA: NEGATIVE
NITRITE: NEGATIVE
PH: 6.5 (ref 5.0–8.0)
Protein, ur: NEGATIVE mg/dL
Specific Gravity, Urine: 1.025 (ref 1.005–1.030)
Urobilinogen, UA: 1 mg/dL (ref 0.0–1.0)

## 2015-06-03 NOTE — Progress Notes (Signed)
Subjective:  Christine Ramsey is a 20 y.o. G1P0000 at [redacted]w[redacted]d being seen today for ongoing prenatal care.  She is currently monitored for the following issues for this low-risk pregnancy and has Chronic back pain; GAD (generalized anxiety disorder); Idiopathic scoliosis s/p Harrington Rod 2013 [place]; MDD (major depressive disorder), recurrent severe, without psychosis ([place]); Substance induced mood disorder ([place]); Polysubstance abuse; Supervision of high risk pregnancy, antepartum; Methadone maintenance treatment affecting pregnancy ([place]); Tobacco smoking affecting pregnancy, antepartum; and Rh negative, antepartum on her problem list.  Patient reports trouble sleeping.  Contractions: Not present. Vag. Bleeding: None.  Movement: Present. Denies leaking of fluid.   The following portions of the patient's history were reviewed and updated as appropriate: allergies, current medications, past family history, past medical history, past social history, past surgical history and problem list. Problem list updated.  Objective:   Filed Vitals:   06/03/15 1509  BP: 119/55  Pulse: 72  Weight: 171 lb 6.4 oz (77.747 kg)    Fetal Status: Fetal Heart Rate (bpm): 122 Fundal Height: 35 cm Movement: Present     General:  Alert, oriented and cooperative. Patient is in no acute distress.  Skin: Skin is warm and dry. No rash noted.   Cardiovascular: Normal heart rate noted  Respiratory: Normal respiratory effort, no problems with respiration noted  Abdomen: Soft, gravid, appropriate for gestational age. Pain/Pressure: Present     Pelvic: Vag. Bleeding: None     Cervical exam deferred        Extremities: Normal range of motion.  Edema: Trace  Mental Status: Normal mood and affect. Normal behavior. Normal judgment and thought content.   Urinalysis:      Assessment and Plan:  Pregnancy: G1P0000 at [redacted]w[redacted]d  1. Methadone maintenance treatment affecting pregnancy ([place])     Continue daily dosing 2.  Sleep problems      Discussed sleep hygeine, blue filters if using phone within 2 hrs of bedtime     May use Benadryl for sleep  Preterm labor symptoms and general obstetric precautions including but not limited to vaginal bleeding, contractions, leaking of fluid and fetal movement were reviewed in detail with the patient. Please refer to After Visit Summary for other counseling recommendations.  Return in about 2 weeks (around 06/17/2015) for [facility].   Seabron Spates, CNM

## 2015-06-03 NOTE — Patient Instructions (Signed)

## 2015-06-22 ENCOUNTER — Other Ambulatory Visit (HOSPITAL_COMMUNITY)
Admission: RE | Admit: 2015-06-22 | Discharge: 2015-06-22 | Disposition: A | Discharge status: Home / Self Care | Payer: Medicaid Other | Source: Ambulatory Visit | Attending: Obstetrics and Gynecology | Admitting: Obstetrics and Gynecology

## 2015-06-22 ENCOUNTER — Ambulatory Visit (INDEPENDENT_AMBULATORY_CARE_PROVIDER_SITE_OTHER): Payer: Medicaid Other | Admitting: Obstetrics and Gynecology

## 2015-06-22 VITALS — BP 140/65 | HR 76 | Temp 98.1°F | Wt 179.9 lb

## 2015-06-22 DIAGNOSIS — F1120 Opioid dependence, uncomplicated: Secondary | ICD-10-CM

## 2015-06-22 DIAGNOSIS — Z113 Encounter for screening for infections with a predominantly sexual mode of transmission: Secondary | ICD-10-CM | POA: Diagnosis not present

## 2015-06-22 DIAGNOSIS — O99323 Drug use complicating pregnancy, third trimester: Secondary | ICD-10-CM

## 2015-06-22 DIAGNOSIS — O0992 Supervision of high risk pregnancy, unspecified, second trimester: Secondary | ICD-10-CM

## 2015-06-22 DIAGNOSIS — O99320 Drug use complicating pregnancy, unspecified trimester: Principal | ICD-10-CM

## 2015-06-22 LAB — POCT URINALYSIS DIP (DEVICE)
Bilirubin Urine: NEGATIVE
GLUCOSE, UA: NEGATIVE mg/dL
HGB URINE DIPSTICK: NEGATIVE
KETONES UR: NEGATIVE mg/dL
Leukocytes, UA: NEGATIVE
Nitrite: NEGATIVE
PROTEIN: NEGATIVE mg/dL
SPECIFIC GRAVITY, URINE: 1.01 (ref 1.005–1.030)
Urobilinogen, UA: 0.2 mg/dL (ref 0.0–1.0)
pH: 6.5 (ref 5.0–8.0)

## 2015-06-22 LAB — OB RESULTS CONSOLE GC/CHLAMYDIA: Gonorrhea: NEGATIVE

## 2015-06-22 LAB — OB RESULTS CONSOLE GBS: STREP GROUP B AG: NEGATIVE

## 2015-06-22 NOTE — Patient Instructions (Addendum)
Third Trimester of Pregnancy The third trimester is from week 29 through week 42, months 7 through 9. The third trimester is a time when the fetus is growing rapidly. At the end of the ninth month, the fetus is about 20 inches in length and weighs 6-10 pounds.  BODY CHANGES Your body goes through many changes during pregnancy. The changes vary from woman to woman.   Your weight will continue to increase. You can expect to gain 25-35 pounds (11-16 kg) by the end of the pregnancy.  You may begin to get stretch marks on your hips, abdomen, and breasts.  You may urinate more often because the fetus is moving lower into your pelvis and pressing on your bladder.  You may develop or continue to have heartburn as a result of your pregnancy.  You may develop constipation because certain hormones are causing the muscles that push waste through your intestines to slow down.  You may develop hemorrhoids or swollen, bulging veins (varicose veins).  You may have pelvic pain because of the weight gain and pregnancy hormones relaxing your joints between the bones in your pelvis. Backaches may result from overexertion of the muscles supporting your posture.  You may have changes in your hair. These can include thickening of your hair, rapid growth, and changes in texture. Some women also have hair loss during or after pregnancy, or hair that feels dry or thin. Your hair will most likely return to normal after your baby is born.  Your breasts will continue to grow and be tender. A yellow discharge may leak from your breasts called colostrum.  Your belly button may stick out.  You may feel short of breath because of your expanding uterus.  You may notice the fetus "dropping," or moving lower in your abdomen.  You may have a bloody mucus discharge. This usually occurs a few days to a week before labor begins.  Your cervix becomes thin and soft (effaced) near your due date. WHAT TO EXPECT AT YOUR PRENATAL  EXAMS  You will have prenatal exams every 2 weeks until week 36. Then, you will have weekly prenatal exams. During a routine prenatal visit:  You will be weighed to make sure you and the fetus are growing normally.  Your blood pressure is taken.  Your abdomen will be measured to track your baby's growth.  The fetal heartbeat will be listened to.  Any test results from the previous visit will be discussed.  You may have a cervical check near your due date to see if you have effaced. At around 36 weeks, your caregiver will check your cervix. At the same time, your caregiver will also perform a test on the secretions of the vaginal tissue. This test is to determine if a type of bacteria, Group B streptococcus, is present. Your caregiver will explain this further. Your caregiver may ask you:  What your birth plan is.  How you are feeling.  If you are feeling the baby move.  If you have had any abnormal symptoms, such as leaking fluid, bleeding, severe headaches, or abdominal cramping.  If you are using any tobacco products, including cigarettes, chewing tobacco, and electronic cigarettes.  If you have any questions. Other tests or screenings that may be performed during your third trimester include:  Blood tests that check for low iron levels (anemia).  Fetal testing to check the health, activity level, and growth of the fetus. Testing is done if you have certain medical conditions or if  there are problems during the pregnancy.  HIV (human immunodeficiency virus) testing. If you are at high risk, you may be screened for HIV during your third trimester of pregnancy. FALSE LABOR You may feel small, irregular contractions that eventually go away. These are called Braxton Hicks contractions, or false labor. Contractions may last for hours, days, or even weeks before true labor sets in. If contractions come at regular intervals, intensify, or become painful, it is best to be seen by your  caregiver.  SIGNS OF LABOR   Menstrual-like cramps.  Contractions that are 5 minutes apart or less.  Contractions that start on the top of the uterus and spread down to the lower abdomen and back.  A sense of increased pelvic pressure or back pain.  A watery or bloody mucus discharge that comes from the vagina. If you have any of these signs before the 37th week of pregnancy, call your caregiver right away. You need to go to the hospital to get checked immediately. HOME CARE INSTRUCTIONS   Avoid all smoking, herbs, alcohol, and unprescribed drugs. These chemicals affect the formation and growth of the baby.  Do not use any tobacco products, including cigarettes, chewing tobacco, and electronic cigarettes. If you need help quitting, ask your health care provider. You may receive counseling support and other resources to help you quit.  Follow your caregiver's instructions regarding medicine use. There are medicines that are either safe or unsafe to take during pregnancy.  Exercise only as directed by your caregiver. Experiencing uterine cramps is a good sign to stop exercising.  Continue to eat regular, healthy meals.  Wear a good support bra for breast tenderness.  Do not use hot tubs, steam rooms, or saunas.  Wear your seat belt at all times when driving.  Avoid raw meat, uncooked cheese, cat litter boxes, and soil used by cats. These carry germs that can cause birth defects in the baby.  Take your prenatal vitamins.  Take 1500-2000 mg of calcium daily starting at the 20th week of pregnancy until you deliver your baby.  Try taking a stool softener (if your caregiver approves) if you develop constipation. Eat more high-fiber foods, such as fresh vegetables or fruit and whole grains. Drink plenty of fluids to keep your urine clear or pale yellow.  Take warm sitz baths to soothe any pain or discomfort caused by hemorrhoids. Use hemorrhoid cream if your caregiver approves.  If  you develop varicose veins, wear support hose. Elevate your feet for 15 minutes, 3-4 times a day. Limit salt in your diet.  Avoid heavy lifting, wear low heal shoes, and practice good posture.  Rest a lot with your legs elevated if you have leg cramps or low back pain.  Visit your dentist if you have not gone during your pregnancy. Use a soft toothbrush to brush your teeth and be gentle when you floss.  A sexual relationship may be continued unless your caregiver directs you otherwise.  Do not travel far distances unless it is absolutely necessary and only with the approval of your caregiver.  Take prenatal classes to understand, practice, and ask questions about the labor and delivery.  Make a trial run to the hospital.  Pack your hospital bag.  Prepare the baby's nursery.  Continue to go to all your prenatal visits as directed by your caregiver. SEEK MEDICAL CARE IF:  You are unsure if you are in labor or if your water has broken.  You have dizziness.  You have  mild pelvic cramps, pelvic pressure, or nagging pain in your abdominal area.  You have persistent nausea, vomiting, or diarrhea.  You have a bad smelling vaginal discharge.  You have pain with urination. SEEK IMMEDIATE MEDICAL CARE IF:   You have a fever.  You are leaking fluid from your vagina.  You have spotting or bleeding from your vagina.  You have severe abdominal cramping or pain.  You have rapid weight loss or gain.  You have shortness of breath with chest pain.  You notice sudden or extreme swelling of your face, hands, ankles, feet, or legs.  You have not felt your baby move in over an hour.  You have severe headaches that do not go away with medicine.  You have vision changes.   This information is not intended to replace advice given to you by your health care provider. Make sure you discuss any questions you have with your health care provider.   Document Released: 03/14/2001 Document  Revised: 04/10/2014 Document Reviewed: 05/21/2012 Elsevier Interactive Patient Education 2016 [employer]. Hypertension During Pregnancy Hypertension, or high blood pressure, is when there is extra pressure inside your blood vessels that carry blood from the heart to the rest of your body (arteries). It can happen at any time in life, including pregnancy. Hypertension during pregnancy can cause problems for you and your baby. Your baby might not weigh as much as he or she should at birth or might be born early (premature). Very bad cases of hypertension during pregnancy can be life-threatening.  Different types of hypertension can occur during pregnancy. These include:  Chronic hypertension. This happens when a woman has hypertension before pregnancy and it continues during pregnancy.  Gestational hypertension. This is when hypertension develops during pregnancy.  Preeclampsia or toxemia of pregnancy. This is a very serious type of hypertension that develops only during pregnancy. It affects the whole body and can be very dangerous for both mother and baby.  Gestational hypertension and preeclampsia usually go away after your baby is born. Your blood pressure will likely stabilize within 6 weeks. Women who have hypertension during pregnancy have a greater chance of developing hypertension later in life or with future pregnancies. RISK FACTORS There are certain factors that make it more likely for you to develop hypertension during pregnancy. These include:  Having hypertension before pregnancy.  Having hypertension during a previous pregnancy.  Being overweight.  Being older than 40 years.  Being pregnant with more than one baby.  Having diabetes or kidney problems. SIGNS AND SYMPTOMS Chronic and gestational hypertension rarely cause symptoms. Preeclampsia has symptoms, which may include:  Increased protein in your urine. Your health care provider will check for this at every prenatal  visit.  Swelling of your hands and face.  Rapid weight gain.  Headaches.  Visual changes.  Being bothered by light.  Abdominal pain, especially in the upper right area.  Chest pain.  Shortness of breath.  Increased reflexes.  Seizures. These occur with a more severe form of preeclampsia, called eclampsia. DIAGNOSIS  You may be diagnosed with hypertension during a regular prenatal exam. At each prenatal visit, you may have:  Your blood pressure checked.  A urine test to check for protein in your urine. The type of hypertension you are diagnosed with depends on when you developed it. It also depends on your specific blood pressure reading.  Developing hypertension before 20 weeks of pregnancy is consistent with chronic hypertension.  Developing hypertension after 20 weeks of pregnancy  is consistent with gestational hypertension.  Hypertension with increased urinary protein is diagnosed as preeclampsia.  Blood pressure measurements that stay above 0000000 systolic or [id number] diastolic are a sign of severe preeclampsia. TREATMENT Treatment for hypertension during pregnancy varies. Treatment depends on the type of hypertension and how serious it is.  If you take medicine for chronic hypertension, you may need to switch medicines.  Medicines called ACE inhibitors should not be taken during pregnancy.  Low-dose aspirin may be suggested for women who have risk factors for preeclampsia.  If you have gestational hypertension, you may need to take a blood pressure medicine that is safe during pregnancy. Your health care provider will recommend the correct medicine.  If you have severe preeclampsia, you may need to be in the hospital. Health care providers will watch you and your baby very closely. You also may need to take medicine called magnesium sulfate to prevent seizures and lower blood pressure.  Sometimes, an early delivery is needed. This may be the case if the condition  worsens. It would be done to protect you and your baby. The only cure for preeclampsia is delivery.  Your health care provider may recommend that you take one low-dose aspirin (81 mg) each day to help prevent high blood pressure during your pregnancy if you are at risk for preeclampsia. You may be at risk for preeclampsia if:  You had preeclampsia or eclampsia during a previous pregnancy.  Your baby did not grow as expected during a previous pregnancy.  You experienced preterm birth with a previous pregnancy.  You experienced a separation of the placenta from the uterus (placental abruption) during a previous pregnancy.  You experienced the loss of your baby during a previous pregnancy.  You are pregnant with more than one baby.  You have other medical conditions, such as diabetes or an autoimmune disease. HOME CARE INSTRUCTIONS  Schedule and keep all of your regular prenatal care appointments. This is important.  Take medicines only as directed by your health care provider. Tell your health care provider about all medicines you take.  Eat as little salt as possible.  Get regular exercise.  Do not drink alcohol.  Do not use tobacco products.  Do not drink products with caffeine.  Lie on your left side when resting. SEEK IMMEDIATE MEDICAL CARE IF:  You have severe abdominal pain.  You have sudden swelling in your hands, ankles, or face.  You gain 4 pounds (1.8 kg) or more in 1 week.  You vomit repeatedly.  You have vaginal bleeding.  You do not feel your baby moving as much.  You have a headache.  You have blurred or double vision.  You have muscle twitching or spasms.  You have shortness of breath.  You have blue fingernails or lips.  You have blood in your urine. MAKE SURE YOU:  Understand these instructions.  Will watch your condition.  Will get help right away if you are not doing well or get worse.   This information is not intended to replace  advice given to you by your health care provider. Make sure you discuss any questions you have with your health care provider.   Document Released: 12/06/2010 Document Revised: 04/10/2014 Document Reviewed: 10/17/2012 Elsevier Interactive Patient Education [employer].

## 2015-06-22 NOTE — Progress Notes (Signed)
Subjective:  Christine Ramsey is a 20 y.o. G1P0000 at [redacted]w[redacted]d being seen today for ongoing prenatal care.  She is currently monitored for the following issues for this low-risk pregnancy and has Chronic back pain; GAD (generalized anxiety disorder); Idiopathic scoliosis s/p [address] [address] 2013 [place]; MDD (major depressive disorder), recurrent severe, without psychosis ([place]); Substance induced mood disorder ([place]); Polysubstance abuse; Supervision of high risk pregnancy, antepartum; Methadone maintenance treatment affecting pregnancy ([place]); Tobacco smoking affecting pregnancy, antepartum; and Rh negative, antepartum on her problem list.  Patient reports no complaints. No H/A, visual disturbance, epigastric [pain.  Contractions: Irregular. Vag. Bleeding: None.  Movement: Present. Denies leaking of fluid.   The following portions of the patient's history were reviewed and updated as appropriate: allergies, current medications, past family history, past medical history, past social history, past surgical history and problem list. Problem list updated.  Objective:   Filed Vitals:   06/22/15 1424  BP: 140/65  Pulse: 76  Temp: 98.1 F (36.7 C)  Weight: 179 lb 14.4 oz (81.602 kg)    Fetal Status: Fetal Heart Rate (bpm): 123   Movement: Present     General:  Alert, oriented and cooperative. Patient is in no acute distress.  Skin: Skin is warm and dry. No rash noted.   Cardiovascular: Normal heart rate noted  Respiratory: Normal respiratory effort, no problems with respiration noted  Abdomen: Soft, gravid, appropriate for gestational age. Pain/Pressure: Present     Pelvic: Vag. Bleeding: None     Cervical exam performed        Extremities: Normal range of motion.  Edema: Trace  Mental Status: Normal mood and affect. Normal behavior. Normal judgment and thought content.   Urinalysis: Urine Protein: Negative Urine Glucose: Negative  Assessment and Plan:  Pregnancy: G1P0000 at [redacted]w[redacted]d  1. Methadone  maintenance treatment affecting pregnancy ([place]) Stable  BP borderline elevated, recheck normal  Term labor symptoms and general obstetric precautions including but not limited to vaginal bleeding, contractions, leaking of fluid and fetal movement were reviewed in detail with the patient. Please refer to After Visit Summary for other counseling recommendations.  Return in about 1 week (around 06/29/2015). PreE precautions  Christine Ramsey Freida Busman, CNM

## 2015-06-22 NOTE — Progress Notes (Signed)
New Blood Pressure reading 110/54 Pulse 75

## 2015-06-23 LAB — GC/CHLAMYDIA PROBE AMP (~~LOC~~) NOT AT ARMC
CHLAMYDIA, DNA PROBE: NEGATIVE
Neisseria Gonorrhea: NEGATIVE

## 2015-06-24 LAB — CULTURE, BETA STREP (GROUP B ONLY)

## 2015-06-26 ENCOUNTER — Encounter (HOSPITAL_COMMUNITY): Payer: Self-pay | Admitting: *Deleted

## 2015-06-26 ENCOUNTER — Inpatient Hospital Stay (HOSPITAL_COMMUNITY)
Admission: AD | Admit: 2015-06-26 | Discharge: 2015-06-29 | DRG: 775 | Disposition: A | Discharge status: Home / Self Care | Payer: Medicaid Other | Source: Ambulatory Visit | Attending: Family Medicine | Admitting: Family Medicine

## 2015-06-26 DIAGNOSIS — O99324 Drug use complicating childbirth: Secondary | ICD-10-CM | POA: Diagnosis not present

## 2015-06-26 DIAGNOSIS — Z981 Arthrodesis status: Secondary | ICD-10-CM | POA: Diagnosis not present

## 2015-06-26 DIAGNOSIS — O99334 Smoking (tobacco) complicating childbirth: Secondary | ICD-10-CM | POA: Diagnosis present

## 2015-06-26 DIAGNOSIS — O99320 Drug use complicating pregnancy, unspecified trimester: Secondary | ICD-10-CM

## 2015-06-26 DIAGNOSIS — O4292 Full-term premature rupture of membranes, unspecified as to length of time between rupture and onset of labor: Principal | ICD-10-CM | POA: Diagnosis present

## 2015-06-26 DIAGNOSIS — O99323 Drug use complicating pregnancy, third trimester: Secondary | ICD-10-CM | POA: Diagnosis present

## 2015-06-26 DIAGNOSIS — Z3A38 38 weeks gestation of pregnancy: Secondary | ICD-10-CM | POA: Diagnosis not present

## 2015-06-26 DIAGNOSIS — F1120 Opioid dependence, uncomplicated: Secondary | ICD-10-CM

## 2015-06-26 DIAGNOSIS — F17210 Nicotine dependence, cigarettes, uncomplicated: Secondary | ICD-10-CM | POA: Diagnosis present

## 2015-06-26 DIAGNOSIS — O0992 Supervision of high risk pregnancy, unspecified, second trimester: Secondary | ICD-10-CM

## 2015-06-26 DIAGNOSIS — F1190 Opioid use, unspecified, uncomplicated: Secondary | ICD-10-CM | POA: Diagnosis not present

## 2015-06-26 DIAGNOSIS — O360120 Maternal care for anti-D [Rh] antibodies, second trimester, not applicable or unspecified: Secondary | ICD-10-CM

## 2015-06-26 DIAGNOSIS — O99332 Smoking (tobacco) complicating pregnancy, second trimester: Secondary | ICD-10-CM

## 2015-06-26 DIAGNOSIS — IMO0001 Reserved for inherently not codable concepts without codable children: Secondary | ICD-10-CM

## 2015-06-26 DIAGNOSIS — O42913 Preterm premature rupture of membranes, unspecified as to length of time between rupture and onset of labor, third trimester: Secondary | ICD-10-CM | POA: Diagnosis not present

## 2015-06-26 LAB — CBC
HCT: 35.8 % — ABNORMAL LOW (ref 36.0–46.0)
Hemoglobin: 12 g/dL (ref 12.0–15.0)
MCH: 29.7 pg (ref 26.0–34.0)
MCHC: 33.5 g/dL (ref 30.0–36.0)
MCV: 88.6 fL (ref 78.0–100.0)
PLATELETS: 247 10*3/uL (ref 150–400)
RBC: 4.04 MIL/uL (ref 3.87–5.11)
RDW: 14.5 % (ref 11.5–15.5)
WBC: 10.5 10*3/uL (ref 4.0–10.5)

## 2015-06-26 LAB — TYPE AND SCREEN
ABO/RH(D): A NEG
ANTIBODY SCREEN: NEGATIVE

## 2015-06-26 LAB — POCT FERN TEST: POCT FERN TEST: POSITIVE

## 2015-06-26 MED ORDER — LACTATED RINGERS IV SOLN
INTRAVENOUS | Status: DC
  Administered 2015-06-26 – 2015-06-27 (×3): via INTRAVENOUS

## 2015-06-26 MED ORDER — OXYCODONE-ACETAMINOPHEN 5-325 MG PO TABS
2.0000 | ORAL_TABLET | ORAL | Status: DC | PRN
  Administered 2015-06-27 – 2015-06-29 (×6): 2 via ORAL
  Filled 2015-06-26 (×6): qty 2

## 2015-06-26 MED ORDER — OXYTOCIN BOLUS FROM INFUSION
500.0000 mL | INTRAVENOUS | Status: DC
  Administered 2015-06-27: 500 mL via INTRAVENOUS

## 2015-06-26 MED ORDER — FENTANYL CITRATE (PF) 100 MCG/2ML IJ SOLN
100.0000 ug | INTRAMUSCULAR | Status: DC | PRN
  Administered 2015-06-27 (×4): 100 ug via INTRAVENOUS
  Filled 2015-06-26 (×4): qty 2

## 2015-06-26 MED ORDER — FLEET ENEMA 7-19 GM/118ML RE ENEM: 1.0000 | ENEMA | RECTAL | Status: DC | PRN

## 2015-06-26 MED ORDER — ACETAMINOPHEN 325 MG PO TABS: 650.0000 mg | ORAL_TABLET | ORAL | Status: DC | PRN

## 2015-06-26 MED ORDER — CITRIC ACID-SODIUM CITRATE 334-500 MG/5ML PO SOLN: 30.0000 mL | ORAL | Status: DC | PRN

## 2015-06-26 MED ORDER — MISOPROSTOL 50MCG HALF TABLET
50.0000 ug | ORAL_TABLET | ORAL | Status: DC | PRN
  Administered 2015-06-27 (×2): 50 ug via ORAL
  Filled 2015-06-26 (×2): qty 0.5

## 2015-06-26 MED ORDER — OXYCODONE-ACETAMINOPHEN 5-325 MG PO TABS: 1.0000 | ORAL_TABLET | ORAL | Status: DC | PRN

## 2015-06-26 MED ORDER — TERBUTALINE SULFATE 1 MG/ML IJ SOLN: 0.2500 mg | Freq: Once | INTRAMUSCULAR | Status: DC | PRN

## 2015-06-26 MED ORDER — LIDOCAINE HCL (PF) 1 % IJ SOLN
30.0000 mL | INTRAMUSCULAR | Status: DC | PRN
  Filled 2015-06-26: qty 30

## 2015-06-26 MED ORDER — LACTATED RINGERS IV SOLN
500.0000 mL | INTRAVENOUS | Status: DC | PRN
  Administered 2015-06-26: 1000 mL via INTRAVENOUS

## 2015-06-26 MED ORDER — OXYTOCIN 10 UNIT/ML IJ SOLN
2.5000 [IU]/h | INTRAVENOUS | Status: DC
  Filled 2015-06-26: qty 10

## 2015-06-26 MED ORDER — ONDANSETRON HCL 4 MG/2ML IJ SOLN: 4.0000 mg | Freq: Four times a day (QID) | INTRAMUSCULAR | Status: DC | PRN

## 2015-06-26 NOTE — Progress Notes (Signed)
Dr. Ernestina Patches notified that pt is fern positive.  Provider states to put in admission orders.

## 2015-06-26 NOTE — Progress Notes (Signed)
Pt up to BR. EFM reapplied short time and then pt to BS via w/c

## 2015-06-26 NOTE — H&P (Signed)
LABOR AND DELIVERY ADMISSION HISTORY AND PHYSICAL NOTE  Christine Ramsey is a 20 y.o. female G1P0000 with IUP at [redacted]w[redacted]d by LMP/10 presenting for PROM.  At 0300 on 3/25, the patient had clear fluid leak.  Currently denies vaginal bleeding.She reports positive fetal movement.  Deneis HA, RUQ pain, scotomata, or photopsia.  Anatomy US showed female fetus, anterior placenta, and echogenic focus above diaphragm.  No repeat US.  PMH: polysubstance abuse; currently on methadone 120mg  Qd. SHX: spinal fusion 2/2 scoliosis.  Prenatal History/Complications: low-risk pregnancy and has Chronic back pain; GAD (generalized anxiety disorder); Idiopathic scoliosis s/p [address] 2013 [place]; MDD (major depressive disorder), recurrent severe, without psychosis ([place]); Substance induced mood disorder ([place]); Polysubstance abuse; Supervision of high risk pregnancy, antepartum; Methadone maintenance treatment affecting pregnancy ([place]); Tobacco smoking affecting pregnancy, antepartum; and Rh negative, antepartum   Past Medical History: Past Medical History  Diagnosis Date  . Scoliosis   . Drug abuse   . Scoliosis     Past Surgical History: Past Surgical History  Procedure Laterality Date  . Back surgery    . Adenoidectomy      Obstetrical History: OB History    Gravida Para Term Preterm AB TAB SAB Ectopic Multiple Living   1 0 0 0 0 0 0 0 0 0       Social History: Social History   Social History  . Marital Status: Single    Spouse Name: N/A  . Number of Children: N/A  . Years of Education: N/A   Social History Main Topics  . Smoking status: Current Every Day Smoker -- 0.25 packs/day    Types: Cigarettes  . Smokeless tobacco: Never Used  . Alcohol Use: No  . Drug Use: Yes    Special: Heroin     Comment: xanax and methadone. 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 Sexual Activity: No   Other Topics Concern  . None   Social History Narrative   Lives at [facility]  ([place]) who is also her legal guardian    Family History: Family History  Problem Relation Age of Onset  . Alcohol abuse Neg Hx   . Suicidality Father     Successfully commited suicide  . Suicidality Mother     attempted multiple times  . Bipolar disorder Mother   . Depression Mother   . Diabetes Mellitus II Neg Hx   . Heart attack Neg Hx     Allergies: No Known Allergies  Prescriptions prior to admission  Medication Sig Dispense Refill Last Dose  . acetaminophen (TYLENOL) 500 MG tablet Take 1,000 mg by mouth every 6 (six) hours as needed for mild pain or headache.    06/25/2015 at 2000  . methadone (DOLOPHINE) 10 MG/ML solution Take 120 mg by mouth daily.    06/26/2015 at 0710  . Prenatal Vit-Fe Fumarate-FA (PRENATAL MULTIVITAMIN) TABS tablet Take 1 tablet by mouth daily.   06/26/2015 at Unknown time  . nicotine (NICODERM CQ - DOSED IN MG/24 HR) 7 mg/24hr patch Place 1 patch (7 mg total) onto the skin daily. (Patient not taking: Reported on 05/18/2015) 28 patch 0 Not Taking  . nicotine polacrilex ([facility] NICOTINE) 2 MG lozenge Take 1 lozenge (2 mg total) by mouth as needed for smoking cessation. (Patient not taking: Reported on 05/18/2015) 100 tablet 0 Not Taking     Review of Systems   All systems reviewed and negative except as stated in HPI  Blood pressure 113/54, pulse 99, resp.  rate 20, last menstrual period 10/04/2014. General appearance: alert Lungs: clear to auscultation bilaterally Heart: regular rate and rhythm Abdomen: soft, non-tender; bowel sounds normal Extremities: No calf swelling or tenderness Presentation: cephalic Fetal monitoring: Cat 1 tracing Uterine activity: irregular Dilation: 2.5 Effacement (%): 80 Station: -2 Exam by:: Doug Sou, RN   Prenatal labs: ABO, Rh: A/NEG/-- (11/23 1352) Antibody: POS (11/23 1352) Rubella: !Error! RPR: NON REAC (02/14 1146)  HBsAg: NEGATIVE (11/23 1352)  HIV: NONREACTIVE (02/14 1146)  GBS: Negative  (03/21 0000)  1 hr Glucola: N/A Genetic screening:  Negative Anatomy US: female, anterior placenta, echogenic focus above diaphragm  Prenatal Transfer Tool  Maternal Diabetes: No Genetic Screening: Normal Maternal Ultrasounds/Referrals: Abnormal:  Findings:   Other: anterior placenta, echogenic focus above diaphragm Fetal Ultrasounds or other Referrals:  None Maternal Substance Abuse:  Yes:  Type: Methadone Significant Maternal Medications:  Meds include: Other: methadone 120mg . Significant Maternal Lab Results: Lab values include: Group B Strep negative  Results for orders placed or performed during the hospital encounter of 06/26/15 (from the past 24 hour(s))  [facility] Time: 06/26/15  7:14 PM  Result Value Ref Range   POCT Fern Test Positive = ruptured amniotic membanes     Patient Active Problem List   Diagnosis Date Noted  . Supervision of high risk pregnancy, antepartum 02/24/2015  . Methadone maintenance treatment affecting pregnancy ([place]) 02/24/2015  . Tobacco smoking affecting pregnancy, antepartum 02/24/2015  . Rh negative, antepartum 02/24/2015  . MDD (major depressive disorder), recurrent severe, without psychosis ([place]) 12/13/2014  . Substance induced mood disorder ([place]) 12/13/2014  . Polysubstance abuse   . Idiopathic scoliosis s/p [facility] 2013 [facility] 07/31/2013  . Chronic back pain 07/04/2013  . GAD (generalized anxiety disorder) 07/04/2013    Assessment: Christine Ramsey is a 20 y.o. G1P0000 at [redacted]w[redacted]d here for PROM  #Labor: Amenable to augmentation.  Will plan to start cytotec 50mg  PO Q4hr.  #Pain: Will try nitrous.  Patient currently taking methadone 120mg  QD so pain management with opoid analgesia will be complicated.  Desires epidural. Due to hx of spinal fusion, anesthesia c/s.   #FWB: Category 1 tracing #ID:  GBS negative.  All other ID negative. #MOF: Breast #MOC:OCPs #Circ:  Will need to ask.  #Rh: negative; will possibly need rhogam  after deliver.  Ram J Bolivia 06/26/2015, 8:41 PM  OB fellow attestation: I have seen and examined this patient; I agree with above documentation in the resident's note.   Christine Ramsey is a 20 y.o. G1P0000 here for prolonged SROM, patient did not seek care for ~13hrs  PE: BP 135/63 mmHg  Pulse 87  Temp(Src) 98.3 F (36.8 C) (Oral)  Resp 18  Ht 5\' 6"  (1.676 m)  Wt 180 lb (81.647 kg)  BMI 29.07 kg/m2  LMP 10/04/2014 Gen: calm comfortable, NAD Resp: normal effort, no distress Abd: gravid  ROS, labs, PMH reviewed  Plan: Admit to LD for augmentation/induction. Start cytotec PO, plan for pitocin.  ROM currently ~18 hours without good cervical change. Monitor for fever, maternal or fetal tachycardia. Pain: set expectations for pain. Baselines back pain is 5-6/10.  Methadone use: high risk of withdrawal.  Scoliosis: complicates pain management plan. Dr. Ermalene Postin feels like he can attempt a L5 but this is not optimal.   Family Medicine, OB Fellow 06/27/2015, 1:17 AM

## 2015-06-26 NOTE — Progress Notes (Signed)
Dr. Bolivia notified that pt is requesting pain medication for her back pain.  Provider states he will discuss options with Dr. Ernestina Patches.

## 2015-06-26 NOTE — MAU Note (Signed)
Pt C/O gush of clear fluid during the night, also had uc's & leg pain.  Leaking has continued, denies bleeding, uc's & leg pain continue.

## 2015-06-27 ENCOUNTER — Inpatient Hospital Stay (HOSPITAL_COMMUNITY): Payer: Medicaid Other | Admitting: Anesthesiology

## 2015-06-27 ENCOUNTER — Encounter (HOSPITAL_COMMUNITY): Payer: Self-pay

## 2015-06-27 DIAGNOSIS — O42913 Preterm premature rupture of membranes, unspecified as to length of time between rupture and onset of labor, third trimester: Secondary | ICD-10-CM

## 2015-06-27 DIAGNOSIS — Z3A37 37 weeks gestation of pregnancy: Secondary | ICD-10-CM

## 2015-06-27 DIAGNOSIS — O99324 Drug use complicating childbirth: Secondary | ICD-10-CM

## 2015-06-27 DIAGNOSIS — F1190 Opioid use, unspecified, uncomplicated: Secondary | ICD-10-CM

## 2015-06-27 LAB — CBC
HEMATOCRIT: 36.5 % (ref 36.0–46.0)
HEMOGLOBIN: 12.2 g/dL (ref 12.0–15.0)
MCH: 29.8 pg (ref 26.0–34.0)
MCHC: 33.4 g/dL (ref 30.0–36.0)
MCV: 89.2 fL (ref 78.0–100.0)
Platelets: 202 10*3/uL (ref 150–400)
RBC: 4.09 MIL/uL (ref 3.87–5.11)
RDW: 14.5 % (ref 11.5–15.5)
WBC: 10.7 10*3/uL — ABNORMAL HIGH (ref 4.0–10.5)

## 2015-06-27 LAB — ABO/RH: ABO/RH(D): A NEG

## 2015-06-27 LAB — RPR: RPR: NONREACTIVE

## 2015-06-27 MED ORDER — SIMETHICONE 80 MG PO CHEW: 80.0000 mg | CHEWABLE_TABLET | ORAL | Status: DC | PRN

## 2015-06-27 MED ORDER — TETANUS-DIPHTH-ACELL PERTUSSIS 5-2.5-18.5 LF-MCG/0.5 IM SUSP: 0.5000 mL | Freq: Once | INTRAMUSCULAR | Status: DC

## 2015-06-27 MED ORDER — WITCH HAZEL-GLYCERIN EX PADS: 1.0000 "application " | MEDICATED_PAD | CUTANEOUS | Status: DC | PRN

## 2015-06-27 MED ORDER — DIBUCAINE 1 % RE OINT: 1.0000 "application " | TOPICAL_OINTMENT | RECTAL | Status: DC | PRN

## 2015-06-27 MED ORDER — ACETAMINOPHEN 325 MG PO TABS: 650.0000 mg | ORAL_TABLET | ORAL | Status: DC | PRN

## 2015-06-27 MED ORDER — SODIUM CHLORIDE 0.9 % IV SOLN: 250.0000 mL | INTRAVENOUS | Status: DC | PRN

## 2015-06-27 MED ORDER — LIDOCAINE HCL (PF) 1 % IJ SOLN
INTRAMUSCULAR | Status: DC | PRN
  Administered 2015-06-27 (×3): 5 mL via EPIDURAL
  Administered 2015-06-27: 3 mL via EPIDURAL
  Administered 2015-06-27: 5 mL via EPIDURAL

## 2015-06-27 MED ORDER — PRENATAL MULTIVITAMIN CH
1.0000 | ORAL_TABLET | Freq: Every day | ORAL | Status: DC
  Administered 2015-06-28 – 2015-06-29 (×2): 1 via ORAL
  Filled 2015-06-27 (×2): qty 1

## 2015-06-27 MED ORDER — ZOLPIDEM TARTRATE 5 MG PO TABS
5.0000 mg | ORAL_TABLET | Freq: Every evening | ORAL | Status: DC | PRN
  Administered 2015-06-27: 5 mg via ORAL
  Filled 2015-06-27: qty 1

## 2015-06-27 MED ORDER — FENTANYL 2.5 MCG/ML BUPIVACAINE 1/10 % EPIDURAL INFUSION (WH - ANES)
14.0000 mL/h | INTRAMUSCULAR | Status: DC | PRN
  Administered 2015-06-27: 14 mL/h via EPIDURAL
  Filled 2015-06-27: qty 125

## 2015-06-27 MED ORDER — LACTATED RINGERS IV SOLN: 500.0000 mL | Freq: Once | INTRAVENOUS | Status: AC

## 2015-06-27 MED ORDER — ONDANSETRON HCL 4 MG PO TABS: 4.0000 mg | ORAL_TABLET | ORAL | Status: DC | PRN

## 2015-06-27 MED ORDER — PHENYLEPHRINE 40 MCG/ML (10ML) SYRINGE FOR IV PUSH (FOR BLOOD PRESSURE SUPPORT)
80.0000 ug | PREFILLED_SYRINGE | INTRAVENOUS | Status: DC | PRN
  Filled 2015-06-27: qty 20

## 2015-06-27 MED ORDER — EPHEDRINE 5 MG/ML INJ: 10.0000 mg | INTRAVENOUS | Status: DC | PRN

## 2015-06-27 MED ORDER — SODIUM CHLORIDE 0.9% FLUSH: 3.0000 mL | INTRAVENOUS | Status: DC | PRN

## 2015-06-27 MED ORDER — IBUPROFEN 600 MG PO TABS
600.0000 mg | ORAL_TABLET | Freq: Four times a day (QID) | ORAL | Status: DC
  Administered 2015-06-27 – 2015-06-29 (×8): 600 mg via ORAL
  Filled 2015-06-27 (×8): qty 1

## 2015-06-27 MED ORDER — SENNOSIDES-DOCUSATE SODIUM 8.6-50 MG PO TABS
2.0000 | ORAL_TABLET | ORAL | Status: DC
Start: 2015-06-28 — End: 2015-06-29
  Administered 2015-06-27 – 2015-06-28 (×2): 2 via ORAL
  Filled 2015-06-27 (×2): qty 2

## 2015-06-27 MED ORDER — SODIUM CHLORIDE 0.9% FLUSH: 3.0000 mL | Freq: Two times a day (BID) | INTRAVENOUS | Status: DC

## 2015-06-27 MED ORDER — ZOLPIDEM TARTRATE 5 MG PO TABS: 5.0000 mg | ORAL_TABLET | Freq: Every evening | ORAL | Status: DC | PRN

## 2015-06-27 MED ORDER — METHADONE HCL 10 MG/ML PO CONC
120.0000 mg | Freq: Every day | ORAL | Status: DC
  Administered 2015-06-27 – 2015-06-29 (×3): 120 mg via ORAL
  Filled 2015-06-27 (×3): qty 12

## 2015-06-27 MED ORDER — BUPIVACAINE IN DEXTROSE 0.75-8.25 % IT SOLN
INTRATHECAL | Status: AC
  Filled 2015-06-27: qty 2

## 2015-06-27 MED ORDER — PHENYLEPHRINE 40 MCG/ML (10ML) SYRINGE FOR IV PUSH (FOR BLOOD PRESSURE SUPPORT): 80.0000 ug | PREFILLED_SYRINGE | INTRAVENOUS | Status: DC | PRN

## 2015-06-27 MED ORDER — DIPHENHYDRAMINE HCL 25 MG PO CAPS: 25.0000 mg | ORAL_CAPSULE | Freq: Four times a day (QID) | ORAL | Status: DC | PRN

## 2015-06-27 MED ORDER — BENZOCAINE-MENTHOL 20-0.5 % EX AERO
1.0000 "application " | INHALATION_SPRAY | CUTANEOUS | Status: DC | PRN
  Administered 2015-06-27 – 2015-06-29 (×2): 1 via TOPICAL
  Filled 2015-06-27: qty 56

## 2015-06-27 MED ORDER — LANOLIN HYDROUS EX OINT: TOPICAL_OINTMENT | CUTANEOUS | Status: DC | PRN

## 2015-06-27 MED ORDER — DIPHENHYDRAMINE HCL 50 MG/ML IJ SOLN: 12.5000 mg | INTRAMUSCULAR | Status: DC | PRN

## 2015-06-27 MED ORDER — SODIUM BICARBONATE 8.4 % IV SOLN
INTRAVENOUS | Status: DC | PRN
  Administered 2015-06-27: 7 mL via EPIDURAL
  Administered 2015-06-27: 3 mL via EPIDURAL

## 2015-06-27 MED ORDER — OXYTOCIN 10 UNIT/ML IJ SOLN
1.0000 m[IU]/min | INTRAVENOUS | Status: DC
  Administered 2015-06-27 (×2): 2 m[IU]/min via INTRAVENOUS
  Filled 2015-06-27: qty 10

## 2015-06-27 MED ORDER — METHYLERGONOVINE MALEATE 0.2 MG/ML IJ SOLN
0.2000 mg | Freq: Once | INTRAMUSCULAR | Status: AC
  Administered 2015-06-27: 0.2 mg via INTRAMUSCULAR

## 2015-06-27 MED ORDER — ONDANSETRON HCL 4 MG/2ML IJ SOLN: 4.0000 mg | INTRAMUSCULAR | Status: DC | PRN

## 2015-06-27 MED ORDER — MEASLES, MUMPS & RUBELLA VAC ~~LOC~~ INJ
0.5000 mL | INJECTION | Freq: Once | SUBCUTANEOUS | Status: DC
  Filled 2015-06-27: qty 0.5

## 2015-06-27 MED ORDER — METHYLERGONOVINE MALEATE 0.2 MG/ML IJ SOLN
INTRAMUSCULAR | Status: AC
  Filled 2015-06-27: qty 1

## 2015-06-27 NOTE — Anesthesia Preprocedure Evaluation (Signed)
Anesthesia Evaluation  Patient identified by MRN, date of birth, ID band Patient awake    Reviewed: Allergy & Precautions, H&P , NPO status , Patient's Chart, lab work & pertinent test results  Airway Mallampati: I  TM Distance: >3 FB Neck ROM: full    Dental no notable dental hx.    Pulmonary neg pulmonary ROS, Current Smoker,    Pulmonary exam normal        Cardiovascular negative cardio ROS Normal cardiovascular exam     Neuro/Psych negative neurological ROS     GI/Hepatic negative GI ROS, On Methadone   Endo/Other  negative endocrine ROS  Renal/GU negative Renal ROS     Musculoskeletal H/O of scoliosis with T3-L4 rods placed in 2013 @[facility] .   Abdominal Normal abdominal exam  (+)   Peds  Hematology negative hematology ROS (+)   Anesthesia Other Findings   Reproductive/Obstetrics (+) Pregnancy                             Anesthesia Physical Anesthesia Plan  ASA: III  Anesthesia Plan: Epidural   Post-op Pain Management:    Induction:   Airway Management Planned:   Additional Equipment:   Intra-op Plan:   Post-operative Plan:   Informed Consent: I have reviewed the patients History and Physical, chart, labs and discussed the procedure including the risks, benefits and alternatives for the proposed anesthesia with the patient or authorized representative who has indicated his/her understanding and acceptance.     Plan Discussed with:   Anesthesia Plan Comments:         Anesthesia Quick Evaluation

## 2015-06-27 NOTE — Progress Notes (Signed)
Christine Ramsey is a 20 y.o. G1P0000 at [redacted]w[redacted]d by ultrasound admitted for rupture of membranes  Subjective:   Objective: BP 141/76 mmHg  Pulse 94  Temp(Src) 98.2 F (36.8 C) (Oral)  Resp 20  Ht 5\' 6"  (1.676 m)  Wt 180 lb (81.647 kg)  BMI 29.07 kg/m2  SpO2 99%  LMP 10/04/2014      FHT:  FHR: 135-140 bpm, variability: moderate,  accelerations:  Present,  decelerations:  Absent UC:   regular, every 2-3 minutes and mild. SVE:   Dilation: 3 Effacement (%): 90 Station: -1 Exam by:: e. poore rnc  Labs: Lab Results  Component Value Date   WBC 10.7* 06/27/2015   HGB 12.2 06/27/2015   HCT 36.5 06/27/2015   MCV 89.2 06/27/2015   PLT 202 06/27/2015    Assessment / Plan: Induction of labor due to PROM,  progressing well on pitocin  Labor: Progressing normally Preeclampsia:  no signs or symptoms of toxicity and intake and ouput balanced Fetal Wellbeing:  Category I Pain Control:  Epidural I/D:  n/a Anticipated MOD:  NSVD  LAWSON, MARIE DARLENE 06/27/2015, 8:52 AM

## 2015-06-27 NOTE — Anesthesia Procedure Notes (Addendum)
Epidural Patient location during procedure: OB Start time: 06/27/2015 8:27 AM End time: 06/27/2015 8:31 AM  Staffing Anesthesiologist: Lyn Hollingshead Performed by: anesthesiologist   Preanesthetic Checklist Completed: patient identified, surgical consent, pre-op evaluation, timeout performed, IV checked, risks and benefits discussed and monitors and equipment checked  Epidural Patient position: sitting Prep: site prepped and draped and DuraPrep Patient monitoring: continuous pulse ox and blood pressure Approach: midline Location: L4-L5 Injection technique: LOR air  Needle:  Needle type: Tuohy  Needle gauge: 17 G Needle length: 9 cm and 9 Needle insertion depth: 6 cm Catheter type: closed end flexible Catheter size: 19 Gauge Catheter at skin depth: 11 cm Test dose: negative and Other  Assessment Sensory level: T9 Events: blood not aspirated, injection not painful, no injection resistance, negative IV test and no paresthesia  Additional Notes Reason for block:procedure for pain  Epidural Patient location during procedure: OB Start time: 06/27/2015 9:52 AM End time: 06/27/2015 9:56 AM  Staffing Anesthesiologist: Lyn Hollingshead Performed by: anesthesiologist   Preanesthetic Checklist Completed: patient identified, surgical consent, pre-op evaluation, timeout performed, IV checked, risks and benefits discussed and monitors and equipment checked  Epidural Patient position: sitting Prep: site prepped and draped and DuraPrep Patient monitoring: continuous pulse ox and blood pressure Approach: midline Location: L3-L4 Injection technique: LOR air  Needle:  Needle type: Tuohy  Needle gauge: 17 G Needle length: 9 cm and 9 Needle insertion depth: 5 cm cm Catheter type: closed end flexible Catheter size: 19 Gauge Catheter at skin depth: 10 cm Test dose: negative and Other  Assessment Sensory level: T12 Events: blood not aspirated, injection not painful, no  injection resistance, negative IV test and no paresthesia  Additional Notes Procedure repeated because of anterior lower abdominal pain. Foley was also placed after the repeat procedure with no discomfort expressed by the patient.Reason for block:procedure for pain

## 2015-06-27 NOTE — Anesthesia Postprocedure Evaluation (Signed)
Anesthesia Post Note  Patient: Christine Ramsey  Procedure(s) Performed: * No procedures listed *  Patient location during evaluation: Mother Baby Anesthesia Type: Epidural Level of consciousness: awake Pain management: pain level controlled Vital Signs Assessment: post-procedure vital signs reviewed and stable Respiratory status: spontaneous breathing Cardiovascular status: stable Postop Assessment: no headache, no backache, epidural receding, patient able to bend at knees, no signs of nausea or vomiting and adequate PO intake Anesthetic complications: no    Last Vitals:  Filed Vitals:   06/27/15 1400 06/27/15 1457  BP: 123/70 126/65  Pulse: 66 75  Temp: 36.8 C 36.7 C  Resp: 18     Last Pain:  Filed Vitals:   06/27/15 1501  PainSc: 0-No pain                 Henrry Feil

## 2015-06-27 NOTE — Lactation Note (Signed)
This note was copied from a baby's chart. Lactation Consultation Note  Mother states she only wants to formula feed.  Patient Name: Christine Ramsey [id number] Date: 06/27/2015     Maternal Data    Feeding    LATCH Score/Interventions                      Lactation Tools Discussed/Used     Consult Status      Vivianne [facility] 06/27/2015, 5:51 PM

## 2015-06-28 ENCOUNTER — Encounter: Payer: Self-pay | Admitting: Advanced Practice Midwife

## 2015-06-28 MED ORDER — RHO D IMMUNE GLOBULIN 1500 UNIT/2ML IJ SOSY
300.0000 ug | PREFILLED_SYRINGE | Freq: Once | INTRAMUSCULAR | Status: AC
  Administered 2015-06-28: 300 ug via INTRAMUSCULAR
  Filled 2015-06-28: qty 2

## 2015-06-28 NOTE — Clinical Social Work Maternal (Signed)
CLINICAL SOCIAL WORK MATERNAL/CHILD NOTE  Patient Details  Name: Christine Ramsey MRN: [id number] Date of Birth: 1995/07/04  Date:  06/28/2015  Clinical Social Worker Initiating Note:  Lucita Ferrara MSW, LCSW Date/ Time Initiated:  06/28/15/1045     Child's Name:  Christine Ramsey Reason   Legal Guardian:  Rodman Pickle  Need for Interpreter:  None   Date of Referral:  06/27/15     Reason for Referral:  Behavioral Health Issues, including SI , Current Substance Use/Substance Use During Pregnancy    Referral Source:  [facility]   Address:  [address], [place] [address]  Phone number:  [phone]   Household Members:  [name]'s mother, brother, uncle, and grandparents  Natural Supports (not living in the home):  Spouse/significant other (currently in jail). [name] reported desire to be able to post his bail next week.   Professional Supports: [facility], methadone maintenance program  Employment: Unemployed   Type of Work:   N/A  Education:  Database administrator Resources:  Medicaid   Other Resources:  [facility]   Cultural/Religious Considerations Which May Impact Care:  None reported  Strengths:  Ability to meet basic needs , Pediatrician chosen , Home prepared for child    Risk Factors/Current Problems:   1. Substance Use: [name] presents with a history of polysubstance use (heroin, cocaine, THC, and unprescribed Xanax), and currently participates in a methadone maintenance program at [facility]. [name] reported a "couple" of relapses during the pregnancy, with most relapse with cocaine 1 month ago. Infant's UDS is negative, umbilical cord drug panel is pending. 2. Mental Health Concerns: [name] presents with a history of anxiety and depression, but [name] denied belief that this is a current problem.   Cognitive State:  Able to Concentrate , Alert , Goal Oriented , Linear Thinking    Mood/Affect:  Congruent with conversation: Ranged from Tearful , Sad , Fearful ,  to happy and excited  CSW Assessment:  CSW received request for consult due to [name] presenting with a history of polysubstance use history, current participation in methadone maintenance program, and history of depression and anxiety.   [name] presented as easily engaged and receptive to the visit. Minimal time required prior to rapport being established.  Upon CSW arrival, [name] began to express anxieties, worries, and fears about infant's increasing NAS scores.  [name] shared that she anticipated withdrawal symptoms, but discussed how she still does not feel prepared for his symptoms.  Per [name], she has a history of participating in a methadone maintenance program at [facility] for almost 2 years (with a 6 week break just prior to her pregnancy).  [name] reported that she received a NICU tour during the pregnancy, but continues to feel unprepared. She asked numerous questions about common signs and symptoms, discussed her desire to use the log to help monitor for symptoms, and expressed intention to implement comfort measures to support the infant.  [name] expressed feelings of guilt for knowing that the infant may go through withdrawal, but also recognized that it was the best decision for him.  [name] shared that when she learned that she was pregnant, she knew that she needed to re-start in the methadone program since the infant would otherwise would have been exposed to "worse".  Overall, [name] expressed strong commitment to ensure the infant's health. She stated that while she wants the infant in the room with her, she wants the infant transferred to the NICU if it best for him.  She expressed feelings of anticipation that this will occur since he has already been shown symptoms at less than 24 hours of age.   Per [name], she had back surgery at age 50 (has rods in back due to scoliosis).  She shared that she was prescribed pain medications, but then began to notice that her pain was not well controlled. [name] expressed regret  for beginning to take more medications, and shared that she began to take oxycodone, hydrocodone, and percocet.  [name] shared that once she was "addicted", she was exposed to other substances.  Per [name], there is also a strong family history of substance use, including with her mother.  [name] reported that she has a history of heroin, cocaine, THC, and unprescribed benzodiazepine use.  [name] reflected upon her prior substance use history, and discussed at length feelings of regret. She shared that she never wants to be actively engaging in substance use again, and reflected upon the numerous negative outcomes, including legal charges.    [name] confirmed [facility] admission in September 2016 due to polysubstance use and substance induced aggressive behaviors. She stated that this admission was pivotal for her since she knew that it was time for a positive change. She stated that she had recently learned that she was pregnant, and that she needed to be compliant with her methadone program. [name]'s UDS on 12/12/14 was positive for benzodiazepines (unprescribed), opiates (heroin), cocaine, and THC.  [name] reported that after she was discharged from [facility], she participated in her methadone maintenance program at [place].   [name] began to discuss how the pregnancy began to motivate her to maintain an active state of recovery. She spoke at length how she is goal orientated, how she wants to be the best mother she can, and that she wants to be a different mother than she had. [name] shared that she has no intentions to re-start substance use due to the harm that it could cause herself and the infant.  [name] expressed normative fears that she is at risk for relapse now that she is no longer pregnant, but she was receptive to creating a plan to reduce risk of relapse (including establishing boundaries with peers, and reducing access to triggers).  [name] stated that there is no substance use in her home, and that her mother is currently "sober". 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    [name] was informed of the hospital drug screen policy. [name] verbalized understanding, and reported desire to be "honest".  [name] reported belief that the umbilical cord may be positive for unprescribed Xanax and THC, with last use approximately 2 months ago ([name] had +UDS for [facility], methadone, and benzodiazepines 02/24/15). [name] originally only reported these substances, but then disclosed "occassional crack cocaine use".  She reported that it was approximately one month ago, and it was one time.  After [name] disclosed this use, she immediately became tearful. [name] expressed intense regret, and stated, "I hate myself".  [name] shared that she cannot forgive herself for exposing her infant to the substance, and wished she could go back in time.  [name] recognized that that she cannot go back in time, and that she can only move forward. She shared an awareness of how ruminating on the regret is limiting her ability to focus on the present moment and to make plans for the future to address her substance use needs.   [name] expressed interest in exploring potential  services to increase support in regards to her substance use. [name] shared that she is interested in learning more about Narcotic Anonymous meetings, and stated that a church that lives nearby has a support group. [name] expressed belief that these groups may be helpful since it can help her to feel less isolated and to have hope since she will be near other people who are in an active state of recovery.  [name] asked numerous questions about what to anticipate and expect if/when the infant's umbilical cord is positive.  [name] is aware that CPS will need to become involved.  [name] expressed intense fear that the infant will be removed from her care, and began to ask questions related to what to anticipate and expect.  CSW discussed range of outcomes, and [name]  shared that she is eager to do whatever is recommended to her. [name] expressed strong commitment to parent this infant.  [name] asked about breastfeeding, since she was informed that it may help with NAS.  CSW discussed exclusion for breastfeeding due to history of polysubstance use. [name] verbalized understanding.   CSW Plan/Description:   1. Patient/Family Education-- perinatal mood and anxiety disorders, hospital drug screen policy 2. Information/Referral to [employer]- outpatient substance use programs, NA meetings 3. Infant's UDS is negative. CSW to monitor infant's umbilical cord drug panel, and will refer to CPS if positive. 4. Psychosocial Support and Ongoing Assessment of Needs    Sharyl Nimrod 06/28/2015, 1:06 PM

## 2015-06-28 NOTE — Progress Notes (Signed)
Post Partum Day 1 Subjective: no complaints, up ad lib, voiding and tolerating PO  Objective: Blood pressure 109/58, pulse 84, temperature 98 F (36.7 C), temperature source Oral, resp. rate 18, height 5\' 6"  (1.676 m), weight 180 lb (81.647 kg), last menstrual period 10/04/2014, SpO2 100 %, unknown if currently breastfeeding.  Physical Exam:  General: alert, cooperative, appears stated age and no distress Lochia: appropriate Uterine Fundus: firm Incision: n/a DVT Evaluation: No evidence of DVT seen on physical exam. Negative Homan's sign. No cords or calf tenderness. No significant calf/ankle edema.   Recent Labs  06/26/15 2015 06/27/15 0751  HGB 12.0 12.2  HCT 35.8* 36.5    Assessment/Plan: Plan for discharge tomorrow   LOS: 2 days   Koren Shiver DARLENE 06/28/2015, 7:44 AM

## 2015-06-28 NOTE — Progress Notes (Signed)
UR chart review completed.  

## 2015-06-29 LAB — RH IG WORKUP (INCLUDES ABO/RH)
ABO/RH(D): A NEG
Fetal Screen: NEGATIVE
GESTATIONAL AGE(WKS): 38
Unit division: 0

## 2015-06-29 MED ORDER — METHOCARBAMOL 500 MG PO TABS: 500.0000 mg | ORAL_TABLET | Freq: Three times a day (TID) | ORAL | Status: DC | PRN

## 2015-06-29 MED ORDER — SENNOSIDES-DOCUSATE SODIUM 8.6-50 MG PO TABS: 2.0000 | ORAL_TABLET | Freq: Every evening | ORAL | Status: DC | PRN

## 2015-06-29 MED ORDER — IBUPROFEN 600 MG PO TABS: 600.0000 mg | ORAL_TABLET | Freq: Four times a day (QID) | ORAL | Status: DC

## 2015-06-29 NOTE — Discharge Instructions (Signed)

## 2015-06-29 NOTE — Discharge Summary (Signed)
OB Discharge Summary     Patient Name: Christine Ramsey DOB: [birth year] MRN: [id number]  Date of admission: 06/26/2015 Delivering MD: Koren Shiver D   Date of discharge: 06/29/2015  Admitting diagnosis: 62 WKS, WATER BROKE Intrauterine pregnancy: [redacted]w[redacted]d     Secondary diagnosis:  Active Problems:   Active labor  Additional problems: PROM, polysubstance abuse (on methadone 120mg  qd), scoliosis s/p spinal fusion     Discharge diagnosis: Term Pregnancy Delivered                                                                                                Post partum procedures:None  Augmentation: Pitocin and Cytotec  Complications: None  Hospital course:  Induction of Labor With Vaginal Delivery   20 y.o. yo G1P1001 at [redacted]w[redacted]d was admitted to the hospital 06/26/2015 for induction of labor.  Indication for induction: PROM.  Patient had an uncomplicated labor course as follows: Membrane Rupture Time/Date: 3:00 AM ,06/26/2015   Intrapartum Procedures: Episiotomy: None [1]                                         Lacerations:  1st degree [2];Labial [10]  Patient had delivery of a Viable infant.  Information for the patient's newborn:  Qunisha, [place] [id number]  Delivery Method: Vaginal, Spontaneous Delivery (Filed from Delivery Summary)   06/27/2015  Details of delivery can be found in separate delivery note.  Patient had a routine postpartum course. Patient is discharged home 06/29/2015.   Physical exam  Filed Vitals:   06/28/15 0210 06/28/15 0615 06/28/15 1700 06/29/15 0549  BP: 116/61 109/58 102/55 109/71  Pulse: 79 84 93 68  Temp: 98.1 F (36.7 C) 98 F (36.7 C) 98 F (36.7 C) 98.1 F (36.7 C)  TempSrc: Oral Oral Oral Oral  Resp: 18 18 18 20   Height:      Weight:      SpO2: 99% 100%     General: alert, cooperative and no distress Lochia: appropriate Uterine Fundus: firm Incision: N/A DVT Evaluation: No evidence of DVT seen on physical exam. Labs: Lab Results   Component Value Date   WBC 10.7* 06/27/2015   HGB 12.2 06/27/2015   HCT 36.5 06/27/2015   MCV 89.2 06/27/2015   PLT 202 06/27/2015   CMP Latest Ref Rng 12/26/2014  Glucose 65 - 99 mg/dL 86  BUN 6 - 20 mg/dL <5(L)  Creatinine 0.44 - 1.00 mg/dL 0.43(L)  Sodium 135 - 145 mmol/L 134(L)  Potassium 3.5 - 5.1 mmol/L 3.4(L)  Chloride 101 - 111 mmol/L 104  CO2 22 - 32 mmol/L 23  Calcium 8.9 - 10.3 mg/dL 8.7(L)  Total Protein 6.5 - 8.1 g/dL -  Total Bilirubin 0.3 - 1.2 mg/dL -  Alkaline Phos 38 - 126 U/L -  AST 15 - 41 U/L -  ALT 14 - 54 U/L -    Discharge instruction: per After Visit Summary and "Baby and Me Booklet".  After visit meds:    Medication  List    ASK your doctor about these medications        acetaminophen 500 MG tablet  Commonly known as:  TYLENOL  Take 1,000 mg by mouth every 6 (six) hours as needed for mild pain or headache.     methadone 10 MG/ML solution  Commonly known as:  DOLOPHINE  Take 120 mg by mouth daily.     nicotine 7 mg/24hr patch  Commonly known as:  NICODERM CQ - dosed in mg/24 hr  Place 1 patch (7 mg total) onto the skin daily.     nicotine polacrilex 2 MG lozenge  Commonly known as:  [facility] NICOTINE  Take 1 lozenge (2 mg total) by mouth as needed for smoking cessation.     prenatal multivitamin Tabs tablet  Take 1 tablet by mouth daily.        Diet: routine diet  Activity: Advance as tolerated. Pelvic rest for 6 weeks.   Outpatient follow up:6 weeks Follow up Appt:No future appointments. Follow up Visit:No Follow-up on file.  Postpartum contraception: IUD  Newborn Data: Live born female  Birth Weight: 7 lb 9.9 oz (3456 g) APGAR: 8, 9  Baby Feeding: Bottle Disposition:NICU   Luiz Blare, DO 06/29/2015, 7:57 AM PGY-2, [place]    APP attestation:  I have seen and examined this patient; I agree with above documentation in the Resident's note.   Christine Ramsey is a 20 y.o. G1P1001   PE: BP 109/71  mmHg  Pulse 68  Temp(Src) 98.1 F (36.7 C) (Oral)  Resp 20  Ht 5\' 6"  (1.676 m)  Wt 180 lb (81.647 kg)  BMI 29.07 kg/m2  SpO2 100%  LMP 10/04/2014  Breastfeeding? Unknown Gen: calm comfortable, NAD Resp: normal effort, no distress Abd: gravid  ROS, labs, PMH reviewed  Plan: Discharge   Clemmons,Lori Grissett 06/30/2015, 9:09 AM

## 2015-06-29 NOTE — Progress Notes (Signed)
CSW followed up with [name] prior to her discharge in order to continue to provide support and to assess for ongoing and umet needs.  [name]'s mother was also present in the room, and [name] provided consent for her mother to remain in the room.   [name] requested to review information about the umbilical cord drug panel, and what to anticipate and expect if/when it is positive for unprescribed substances. She shared that she is scared about CPS becoming involved, and is having a difficult time remember all the information that was shared yesterday due to ruminating only on the worst case scenario of the infant being removed from her home and placed in foster care.  [name] recognized an awareness that this is the worst case scenario, and there are opportunities to create a plan for the infant and herself to demonstrate readiness to continue in an active state of recovery and commitment to parenting the infant while sober.  CSW reviewed potential outcomes with [name] and MGM, but emphasized that it is difficult to predict since the umbilical cord drug panel has not yet returned, and CPS is the entity that makes final decisions. They verbalized understanding, but expressed appreciation for the information.  MGM stated that the [name]'s grandparents, Bethena Roys and Salley Scarlet, would be a "perfect" safety resource.  MGM shared that the [name]'s mother is retired, and could be at home at all times to supervise the [facility] with the infant.  [name] continued to discuss goals of beginning to attend "recovery meetings" each Wednesday at her church, and expressed appreciation for the information about local NA meetings.  [name] discussed intention to put forth effort while the infant is in the NICU to actively engage in treatment in order to be the mother she wants to be for the infant.   [name] requested to be contacted when the infant's umbilical cord drug panel returns.  CSW provided [name] with NICU CSW contact information, and she agreed to continue to  remain in contact with CSW in order to receive ongoing support and ask questions as they arise.   No barriers to [name]'s discharge.  CSW will continue to remain involved during infant's admission.

## 2015-08-16 ENCOUNTER — Encounter: Payer: Self-pay | Admitting: Student

## 2015-08-16 NOTE — Progress Notes (Signed)
Patient ID: Christine Ramsey, female   DOB: 1995-05-11, 20 y.o.   MRN: [id number] Patient no showed for appointment. Discussed with provider the need to reschedule or not. Robyne Askew, NP states patient may return as desired.

## 2015-10-20 ENCOUNTER — Emergency Department (HOSPITAL_COMMUNITY)
Admission: EM | Admit: 2015-10-20 | Discharge: 2015-10-20 | Disposition: A | Discharge status: Home / Self Care | Payer: Medicaid Other | Attending: Emergency Medicine | Admitting: Emergency Medicine

## 2015-10-20 ENCOUNTER — Emergency Department (HOSPITAL_COMMUNITY): Payer: Medicaid Other

## 2015-10-20 ENCOUNTER — Encounter (HOSPITAL_COMMUNITY): Payer: Self-pay

## 2015-10-20 DIAGNOSIS — M5442 Lumbago with sciatica, left side: Secondary | ICD-10-CM | POA: Diagnosis not present

## 2015-10-20 DIAGNOSIS — F17210 Nicotine dependence, cigarettes, uncomplicated: Secondary | ICD-10-CM | POA: Insufficient documentation

## 2015-10-20 DIAGNOSIS — M549 Dorsalgia, unspecified: Secondary | ICD-10-CM | POA: Diagnosis present

## 2015-10-20 MED ORDER — IBUPROFEN 600 MG PO TABS: 600.0000 mg | ORAL_TABLET | Freq: Four times a day (QID) | ORAL | Status: DC | PRN

## 2015-10-20 MED ORDER — ACETAMINOPHEN 500 MG PO TABS
1000.0000 mg | ORAL_TABLET | Freq: Once | ORAL | Status: AC
  Administered 2015-10-20: 1000 mg via ORAL
  Filled 2015-10-20: qty 2

## 2015-10-20 MED ORDER — KETOROLAC TROMETHAMINE 30 MG/ML IJ SOLN
30.0000 mg | Freq: Once | INTRAMUSCULAR | Status: AC
  Administered 2015-10-20: 30 mg via INTRAMUSCULAR
  Filled 2015-10-20: qty 1

## 2015-10-20 MED ORDER — DEXAMETHASONE SODIUM PHOSPHATE 10 MG/ML IJ SOLN
10.0000 mg | Freq: Once | INTRAMUSCULAR | Status: AC
  Administered 2015-10-20: 10 mg via INTRAMUSCULAR
  Filled 2015-10-20: qty 1

## 2015-10-20 MED ORDER — PREDNISONE 10 MG (21) PO TBPK: 10.0000 mg | ORAL_TABLET | Freq: Every day | ORAL | Status: DC
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
## 2015-10-20 NOTE — ED Notes (Signed)
Pt transported to and from radiology on stretcher with tech, tolerated well.  

## 2016-04-13 ENCOUNTER — Other Ambulatory Visit: Payer: Self-pay | Admitting: Obstetrics & Gynecology

## 2016-09-24 IMAGING — US US OB COMP LESS 14 WK
1 series · 14 of 24 positions shown · non-contrast
Comparison: None.

CLINICAL DATA: First trimester of pregnancy, substance abuse.

EXAM:
OBSTETRIC <14 WK ULTRASOUND
TECHNIQUE: Transabdominal ultrasound was performed for evaluation of the
gestation as well as the maternal uterus and adnexal regions.

[Series 1: us ob comp less 14 wk · 0.18mm/px · 14 of 24 slices shown]
[im 1/24]
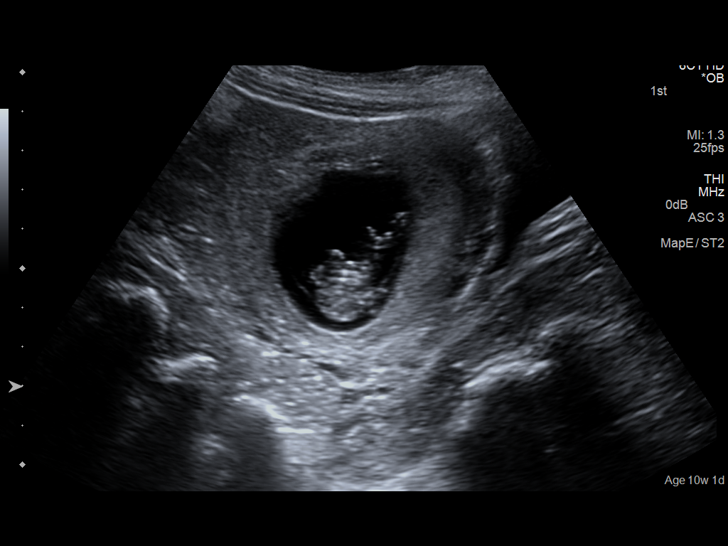
[im 3/24]
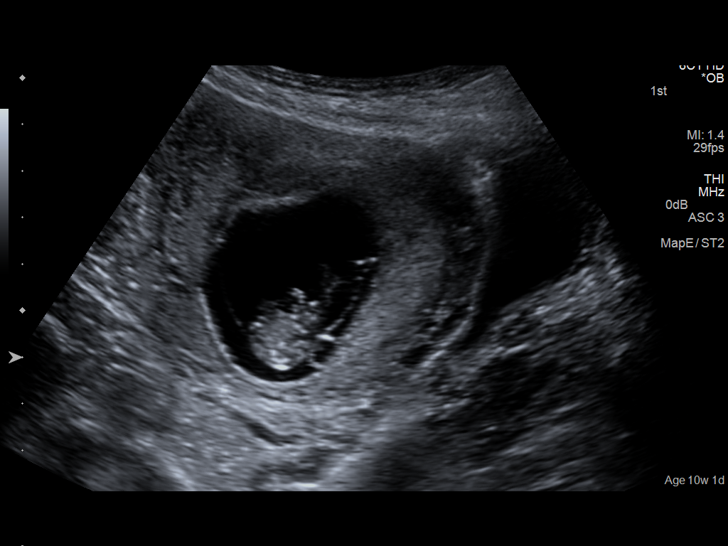
[im 5/24]
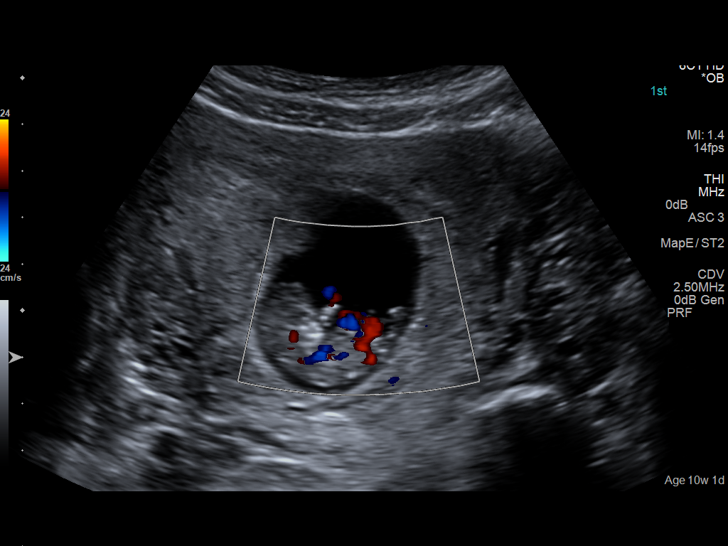
[im 7/24]
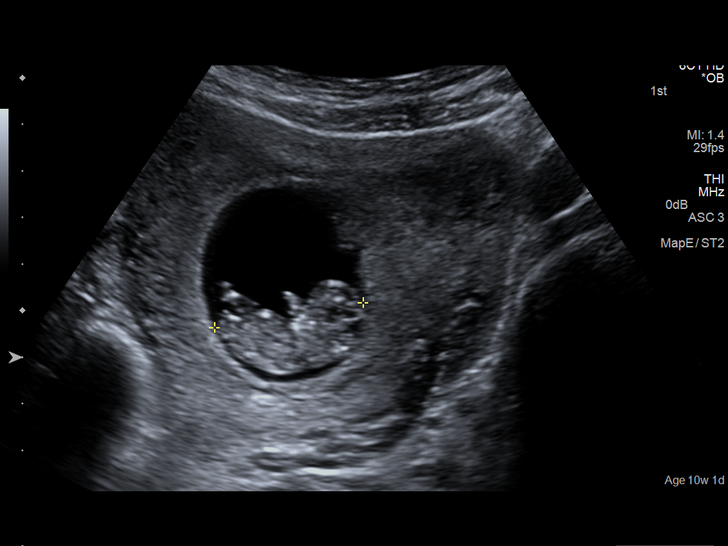
[im 8/24]
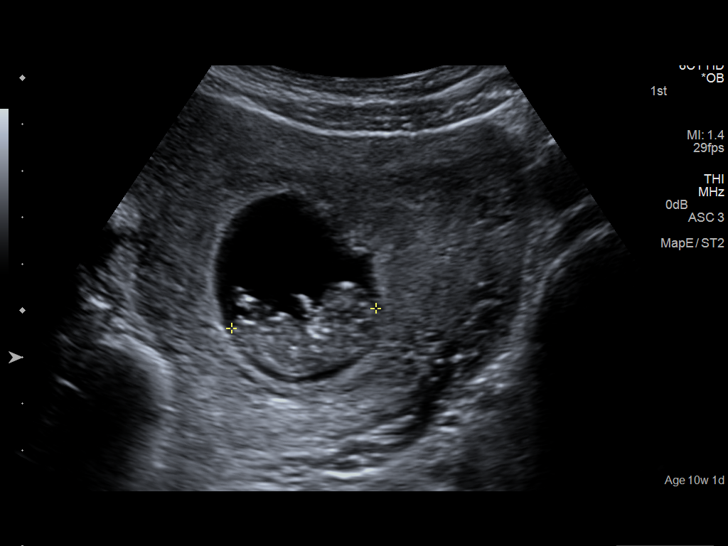
[im 10/24]
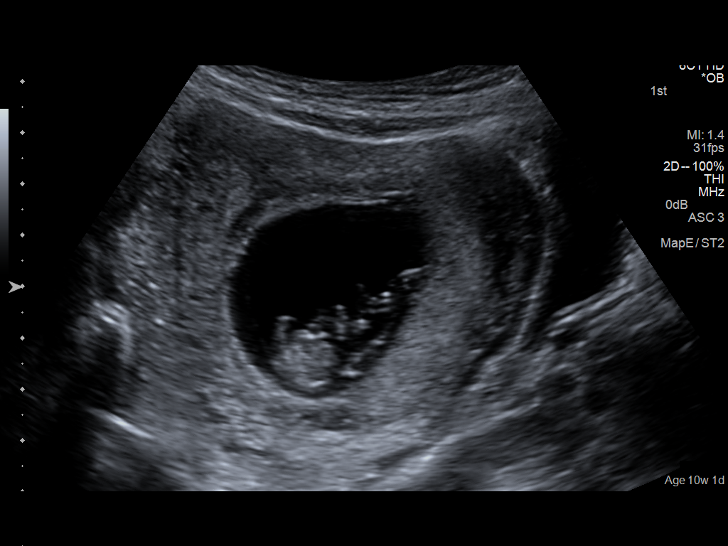
[im 12/24]
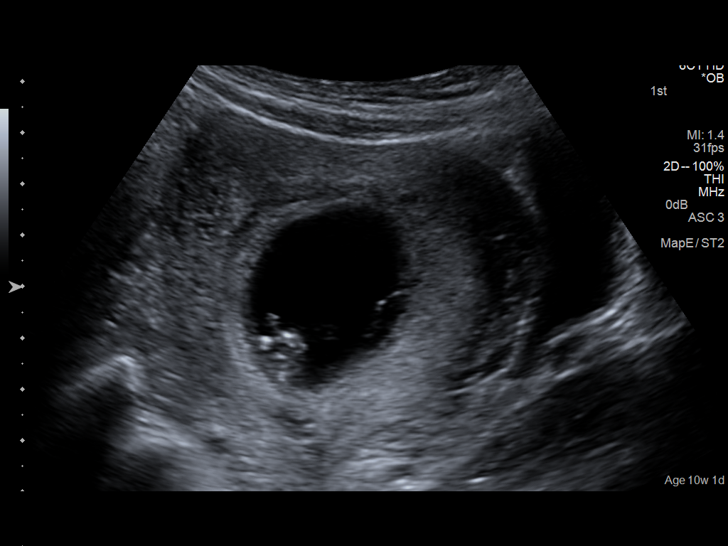
[im 13/24]
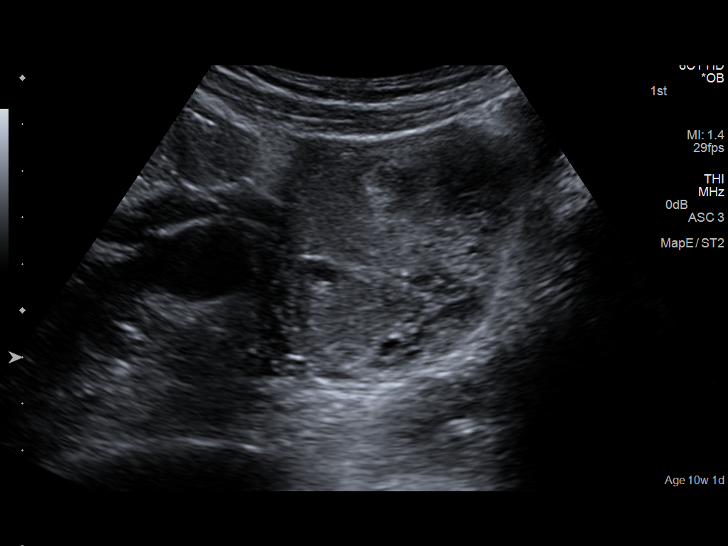
[im 15/24]
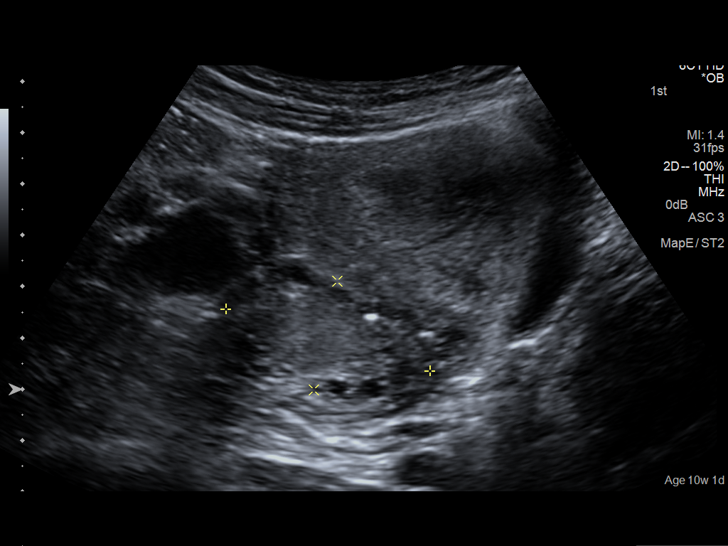
[im 17/24]
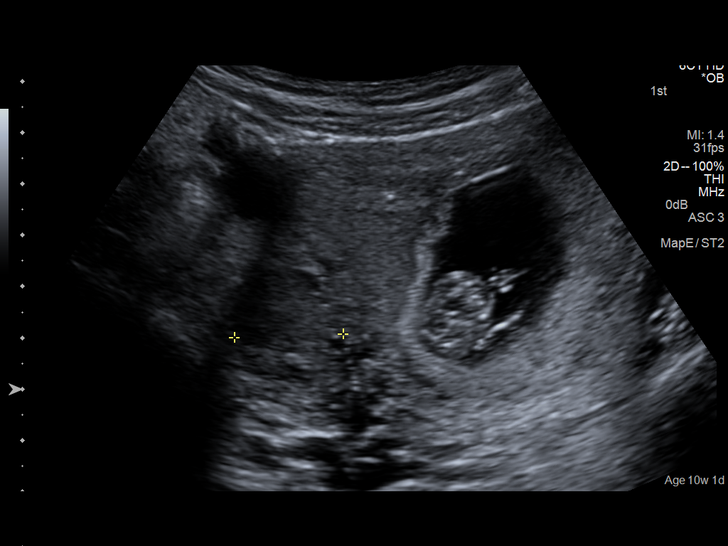
[im 19/24]
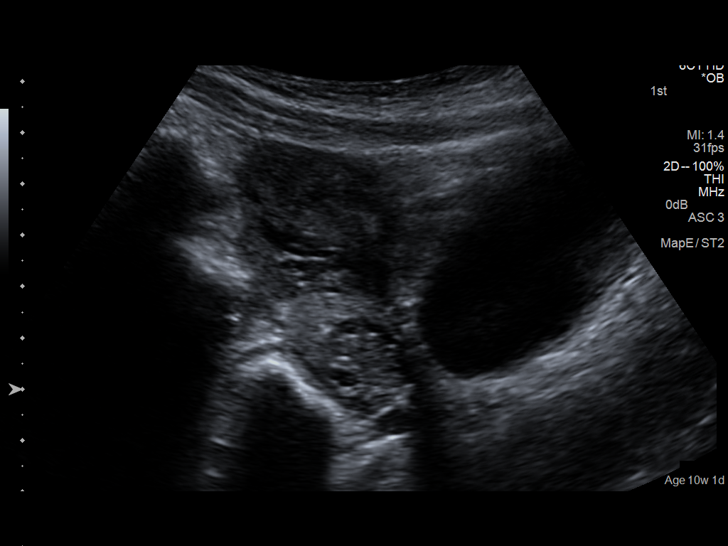
[im 20/24]
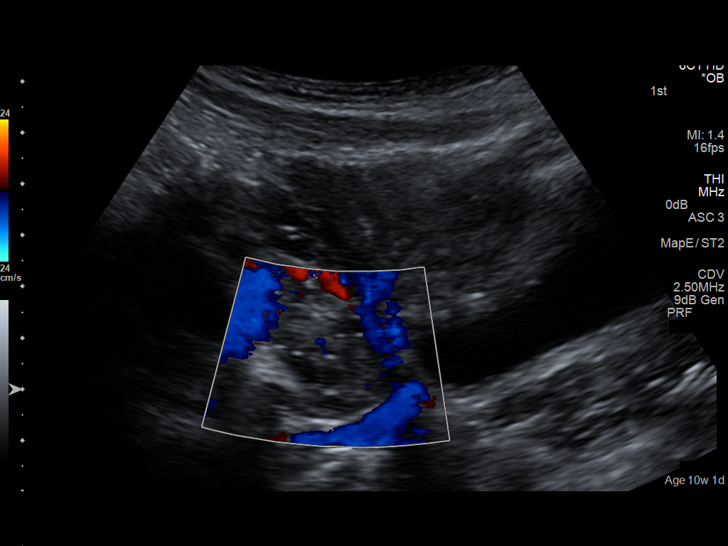
[im 22/24]
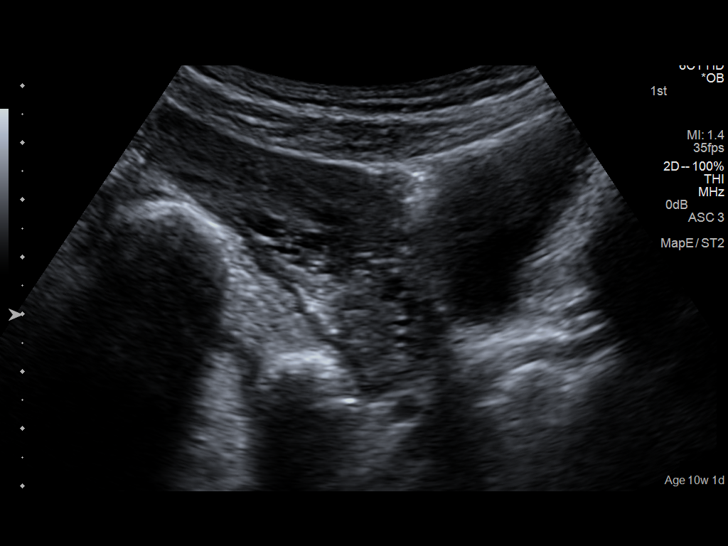
[im 24/24]
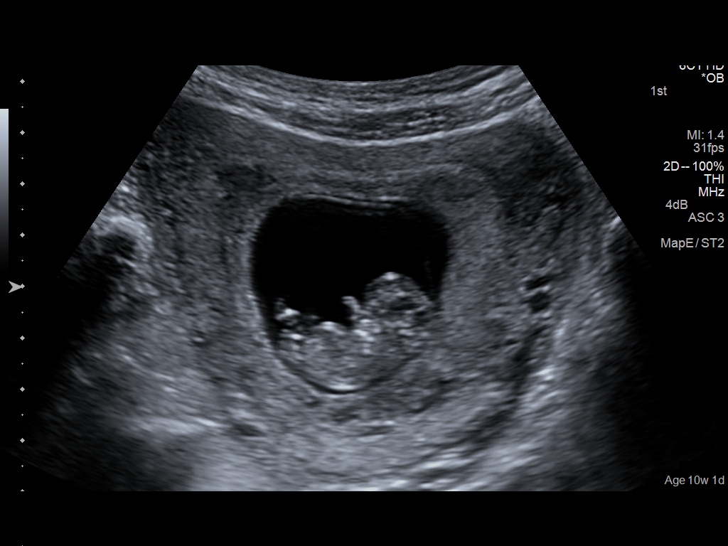

[14 of 24 positions shown; findings below may reference images not displayed]

FINDINGS: Intrauterine gestational sac: Visualized/normal in shape.

Yolk sac:  Visualized.

Embryo:  Visualized.

Cardiac Activity: Visualized.

Heart Rate: 152 bpm

CRL:   31.7  mm   10 w 0 d                  US EDC: July 10, 2015.

Maternal uterus/adnexae: Small subchorionic hemorrhage is noted. No
free fluid is noted. Ovaries appear normal.
IMPRESSION: Single live intrauterine gestation of 10 weeks 0 days. Small
subchronic hemorrhage is noted.

## 2017-10-18 ENCOUNTER — Encounter (HOSPITAL_COMMUNITY): Payer: Self-pay | Admitting: Radiology

## 2017-10-19 ENCOUNTER — Ambulatory Visit
Admission: RE | Admit: 2017-10-19 | Discharge: 2017-10-19 | Disposition: A | Discharge status: Home / Self Care | Payer: Self-pay | Source: Ambulatory Visit | Attending: Pediatrics | Admitting: Pediatrics

## 2017-10-19 ENCOUNTER — Other Ambulatory Visit: Payer: Self-pay | Admitting: Pediatrics

## 2017-10-19 DIAGNOSIS — R52 Pain, unspecified: Secondary | ICD-10-CM

## 2017-10-23 ENCOUNTER — Other Ambulatory Visit (HOSPITAL_COMMUNITY): Payer: Self-pay | Admitting: Physician Assistant

## 2017-10-23 DIAGNOSIS — R221 Localized swelling, mass and lump, neck: Secondary | ICD-10-CM

## 2017-10-31 ENCOUNTER — Other Ambulatory Visit: Payer: Self-pay | Admitting: Student

## 2017-11-01 ENCOUNTER — Other Ambulatory Visit: Payer: Self-pay

## 2017-11-01 ENCOUNTER — Ambulatory Visit (HOSPITAL_COMMUNITY)
Admission: RE | Admit: 2017-11-01 | Discharge: 2017-11-01 | Disposition: A | Discharge status: Home / Self Care | Payer: Medicaid Other | Source: Ambulatory Visit | Attending: Physician Assistant | Admitting: Physician Assistant

## 2017-11-01 ENCOUNTER — Encounter (HOSPITAL_COMMUNITY): Payer: Self-pay | Admitting: *Deleted

## 2017-11-01 DIAGNOSIS — D360 Benign neoplasm of lymph nodes: Secondary | ICD-10-CM | POA: Diagnosis not present

## 2017-11-01 DIAGNOSIS — R221 Localized swelling, mass and lump, neck: Secondary | ICD-10-CM | POA: Diagnosis present

## 2017-11-01 LAB — PROTIME-INR
INR: 0.97
PROTHROMBIN TIME: 12.8 s (ref 11.4–15.2)

## 2017-11-01 LAB — CBC
HCT: 41.1 % (ref 36.0–46.0)
Hemoglobin: 13.6 g/dL (ref 12.0–15.0)
MCH: 29.6 pg (ref 26.0–34.0)
MCHC: 33.1 g/dL (ref 30.0–36.0)
MCV: 89.3 fL (ref 78.0–100.0)
PLATELETS: 266 10*3/uL (ref 150–400)
RBC: 4.6 MIL/uL (ref 3.87–5.11)
RDW: 12.4 % (ref 11.5–15.5)
WBC: 6.6 10*3/uL (ref 4.0–10.5)

## 2017-11-01 LAB — APTT: aPTT: 34 seconds (ref 24–36)

## 2017-11-01 MED ORDER — LIDOCAINE HCL (PF) 1 % IJ SOLN
INTRAMUSCULAR | Status: AC
  Filled 2017-11-01: qty 30

## 2017-11-01 MED ORDER — MIDAZOLAM HCL 2 MG/2ML IJ SOLN
INTRAMUSCULAR | Status: AC
  Filled 2017-11-01: qty 2

## 2017-11-01 MED ORDER — MIDAZOLAM HCL 2 MG/2ML IJ SOLN
INTRAMUSCULAR | Status: AC | PRN
  Administered 2017-11-01 (×2): 1 mg via INTRAVENOUS

## 2017-11-01 MED ORDER — FENTANYL CITRATE (PF) 100 MCG/2ML IJ SOLN
INTRAMUSCULAR | Status: AC
  Filled 2017-11-01: qty 2

## 2017-11-01 MED ORDER — SODIUM CHLORIDE 0.9 % IV SOLN: INTRAVENOUS | Status: DC

## 2017-11-01 MED ORDER — FENTANYL CITRATE (PF) 100 MCG/2ML IJ SOLN
INTRAMUSCULAR | Status: AC | PRN
  Administered 2017-11-01 (×2): 50 ug via INTRAVENOUS

## 2017-11-01 NOTE — Sedation Documentation (Signed)
Patient is resting comfortably. 

## 2017-11-01 NOTE — Consult Note (Signed)
Chief Complaint: Patient was seen in consultation today for ultrasound-guided aspiration/biopsy of left cervical soft tissue mass  Referring Physician(s): Althisar,Henry  Supervising Physician: Markus Daft  Patient Status: [facility] - Out-pt  History of Present Illness: Christine Ramsey is a 23 y.o. female smoker with past medical history of polysubstance use, currently on methadone, and scoliosis with prior spinal fusion recently treated for lymphadenitis left neck region.  Patient states that she has had an enlarging ,tender knot/cyst in her left neck region for approximately 2 months. She was treated with amoxicillin with some slight improvement in size of the mass.  Outside ultrasound of region revealed 3.6 cm left cervical soft tissue mass.  She presents today for image guided aspiration/biopsy of the mass for further evaluation.  Past Medical History:  Diagnosis Date  . Drug abuse ([place])   . Scoliosis   . Scoliosis     Past Surgical History:  Procedure Laterality Date  . ADENOIDECTOMY    . BACK SURGERY      Allergies: Prednisone  Medications: Prior to Admission medications   Medication Sig Start Date End Date Taking? Authorizing Provider  acetaminophen (TYLENOL) 500 MG tablet Take 1,000 mg by mouth 2 (two) times daily.    Yes [provider]  amoxicillin (AMOXIL) 500 MG tablet Take 500 mg by mouth 2 (two) times daily. 10/11/17  Yes [provider]  baclofen (LIORESAL) 10 MG tablet Take 10 mg by mouth 3 (three) times daily as needed for muscle spasms.  10/11/17  Yes [provider]  ibuprofen (ADVIL,MOTRIN) 600 MG tablet Take 1 tablet (600 mg total) by mouth every 6 (six) hours as needed. Patient taking differently: Take 600 mg by mouth 3 (three) times daily.  10/20/15  Yes Isla Pence, MD  methadone (DOLOPHINE) 10 MG/ML solution Take 160 mg by mouth every morning.    Yes [provider]  naproxen sodium (ALEVE) 220 MG tablet Take 440 mg  by mouth 2 (two) times daily as needed (pain).   Yes [provider]  predniSONE (STERAPRED UNI-PAK 21 TAB) 10 MG (21) TBPK tablet Take 1 tablet (10 mg total) by mouth daily. Take 6 tabs by mouth daily  for 2 days, then 5 tabs for 2 days, then 4 tabs for 2 days, then 3 tabs for 2 days, 2 tabs for 2 days, then 1 tab by mouth daily for 2 days Patient not taking: Reported on 10/30/2017 10/20/15   Isla Pence, MD     Family History  Problem Relation Age of Onset  . Suicidality Father        Successfully commited suicide  . Suicidality Mother        attempted multiple times  . Bipolar disorder Mother   . Depression Mother   . Alcohol abuse Neg Hx   . Diabetes Mellitus II Neg Hx   . Heart attack Neg Hx     Social History   Socioeconomic History  . Marital status: Single    Spouse name: Not on file  . Number of children: Not on file  . Years of education: Not on file  . Highest education level: Not on file  Occupational History  . Not on file  Social Needs  . Financial resource strain: Not on file  . Food insecurity:    Worry: Not on file    Inability: Not on file  . Transportation needs:    Medical: Not on file    Non-medical: Not on file  Tobacco  Use  . Smoking status: Current Every Day Smoker    Packs/day: 0.25    Types: Cigarettes  . Smokeless tobacco: Never Used  Substance and Sexual Activity  . Alcohol use: No  . Drug use: Yes    Types: Heroin    Comment: xanax and methadone. 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 Sexual activity: Never  Lifestyle  . Physical activity:    Days per week: Not on file    Minutes per session: Not on file  . Stress: Not on file  Relationships  . Social connections:    Talks on phone: Not on file    Gets together: Not on file    Attends religious service: Not on file    Active member of club or organization: Not on file    Attends meetings of clubs or organizations: Not on file    Relationship status: Not  on file  Other Topics Concern  . Not on file  Social History Narrative   Lives at [facility] ([place]) who is also her legal guardian      Review of Systems denies fever, chest pain, dyspnea, cough, abdominal pain, nausea, vomiting or bleeding.  She does have occasional headaches and back pain  Vital Signs: BP (!) 118/58 (BP Location: Right Arm)   Pulse 73   Temp 98.1 F (36.7 C) (Oral)   Ht 5\' 6"  (1.676 m)   Wt 220 lb (99.8 kg)   SpO2 99%   BMI 35.51 kg/m   Physical Exam awake, alert.  Chest clear to auscultation bilaterally.  Heart with regular rate and rhythm.  Abdomen soft, positive bowel sounds, nontender.  Extremities with full range of motion.  Some slight fullness in the left upper cervical (ant neck) region, currently nontender  Imaging: [place] Outside Films Body  Result Date: 10/19/2017 This examination belongs to an outside facility and is stored here for comparison purposes only.  Contact the originating outside institution for any associated report or interpretation.   Labs:  CBC: Recent Labs    11/01/17 1120  WBC 6.6  HGB 13.6  HCT 41.1  PLT 266    COAGS: No results for input(s): INR, APTT in the last 8760 hours.  BMP: No results for input(s): NA, K, CL, CO2, GLUCOSE, BUN, CALCIUM, CREATININE, GFRNONAA, GFRAA in the last 8760 hours.  Invalid input(s): CMP  LIVER FUNCTION TESTS: No results for input(s): BILITOT, AST, ALT, ALKPHOS, PROT, ALBUMIN in the last 8760 hours.  TUMOR MARKERS: No results for input(s): AFPTM, CEA, CA199, CHROMGRNA in the last 8760 hours.  Assessment and Plan:  22 y.o. female smoker with past medical history of polysubstance use, currently on methadone, and scoliosis with prior spinal fusion recently treated for lymphadenitis left neck region.  No reported cancer history. Patient states that she has had an enlarging ,tender knot/cyst in her left neck region for approximately 2 months. She was treated with  amoxicillin with some slight improvement in size of the mass.  Outside ultrasound of region revealed 3.6 cm left cervical soft tissue mass.  She presents today for image guided aspiration/biopsy of the mass for further evaluation.Risks and benefits discussed with the patient including, but not limited to bleeding, infection, damage to adjacent structures or low yield requiring additional tests.  All of the patient's questions were answered, patient is agreeable to proceed. Consent signed and in chart.     Thank you for this interesting consult.  I greatly enjoyed meeting Christine Ramsey  and look forward to participating in their care.  A copy of this report was sent to the requesting provider on this date.  Electronically Signed: D. Rowe Robert, PA-C 11/01/2017, 12:02 PM   I spent a total of 25 minutes  in face to face in clinical consultation, greater than 50% of which was counseling/coordinating care for ultrasound-guided left cervical (neck)soft tissue mass aspiration/biopsy

## 2017-11-01 NOTE — Sedation Documentation (Signed)
Bandaid L neck

## 2017-11-01 NOTE — Discharge Instructions (Addendum)
Needle Biopsy, Care After °Refer to this sheet in the next few weeks. These instructions provide you with information about caring for yourself after your procedure. Your health care provider may also give you more specific instructions. Your treatment has been planned according to current medical practices, but problems sometimes occur. Call your health care provider if you have any problems or questions after your procedure. °What can I expect after the procedure? °After your procedure, it is common to have soreness, bruising, or mild pain at the biopsy site. This should go away in a few days. °Follow these instructions at home: °· Rest as directed by your health care provider. °· Take medicines only as directed by your health care provider. °· There are many different ways to close and cover the biopsy site, including stitches (sutures), skin glue, and adhesive strips. Follow your health care provider's instructions about: °? Biopsy site care. °? Bandage (dressing) changes and removal. °? Biopsy site closure removal. °· Check your biopsy site every day for signs of infection. Watch for: °? Redness, swelling, or pain. °? Fluid, blood, or pus. °Contact a health care provider if: °· You have a fever. °· You have redness, swelling, or pain at the biopsy site that lasts longer than a few days. °· You have fluid, blood, or pus coming from the biopsy site. °· You feel nauseous. °· You vomit. °Get help right away if: °· You have shortness of breath. °· You have trouble breathing. °· You have chest pain. °· You feel dizzy or you faint. °· You have bleeding that does not stop with pressure or a bandage. °· You cough up blood. °· You have pain in your abdomen. °This information is not intended to replace advice given to you by your health care provider. Make sure you discuss any questions you have with your health care provider. °Document Released: 08/04/2014 Document Revised: 08/26/2015 Document Reviewed:  03/16/2014 °Elsevier Interactive Patient Education © 2018 [employer] °Moderate Conscious Sedation, Adult, Care After °These instructions provide you with information about caring for yourself after your procedure. Your health care provider may also give you more specific instructions. Your treatment has been planned according to current medical practices, but problems sometimes occur. Call your health care provider if you have any problems or questions after your procedure. °What can I expect after the procedure? °After your procedure, it is common: °· To feel sleepy for several hours. °· To feel clumsy and have poor balance for several hours. °· To have poor judgment for several hours. °· To vomit if you eat too soon. ° °Follow these instructions at home: °For at least 24 hours after the procedure: ° °· Do not: °? Participate in activities where you could fall or become injured. °? Drive. °? Use heavy machinery. °? Drink alcohol. °? Take sleeping pills or medicines that cause drowsiness. °? Make important decisions or sign legal documents. °? Take care of children on your own. °· Rest. °Eating and drinking °· Follow the diet recommended by your health care provider. °· If you vomit: °? Drink water, juice, or soup when you can drink without vomiting. °? Make sure you have little or no nausea before eating solid foods. °General instructions °· Have a responsible adult stay with you until you are awake and alert. °· Take over-the-counter and prescription medicines only as told by your health care provider. °· If you smoke, do not smoke without supervision. °· Keep all follow-up visits as told by your health care   provider. This is important. °Contact a health care provider if: °· You keep feeling nauseous or you keep vomiting. °· You feel light-headed. °· You develop a rash. °· You have a fever. °Get help right away if: °· You have trouble breathing. °This information is not intended to replace advice given to you  by your health care provider. Make sure you discuss any questions you have with your health care provider. °Document Released: 01/08/2013 Document Revised: 08/23/2015 Document Reviewed: 07/10/2015 °Elsevier Interactive Patient Education © 2018 Elsevier Inc. ° °

## 2017-11-01 NOTE — Procedures (Signed)
[place] was used to examine the left neck.  No cystic mass present.  However, prominent left cervical lymph nodes.  US guided core biopsy of a prominent node performed.   4 cores obtained.  Specimens placed in saline.  Minimal blood loss and no immediate complication.

## 2019-07-14 ENCOUNTER — Encounter: Payer: Self-pay | Admitting: *Deleted

## 2019-07-24 ENCOUNTER — Other Ambulatory Visit: Payer: Self-pay

## 2019-07-24 ENCOUNTER — Other Ambulatory Visit (HOSPITAL_COMMUNITY)
Admission: RE | Admit: 2019-07-24 | Discharge: 2019-07-24 | Disposition: A | Discharge status: Home / Self Care | Payer: Medicaid Other | Source: Ambulatory Visit | Attending: Obstetrics and Gynecology | Admitting: Obstetrics and Gynecology

## 2019-07-24 ENCOUNTER — Encounter: Payer: Self-pay | Admitting: Obstetrics and Gynecology

## 2019-07-24 ENCOUNTER — Ambulatory Visit (INDEPENDENT_AMBULATORY_CARE_PROVIDER_SITE_OTHER): Payer: Medicaid Other | Admitting: Obstetrics and Gynecology

## 2019-07-24 DIAGNOSIS — Z01419 Encounter for gynecological examination (general) (routine) without abnormal findings: Secondary | ICD-10-CM | POA: Diagnosis present

## 2019-07-24 DIAGNOSIS — Z202 Contact with and (suspected) exposure to infections with a predominantly sexual mode of transmission: Secondary | ICD-10-CM | POA: Insufficient documentation

## 2019-07-24 DIAGNOSIS — Z1151 Encounter for screening for human papillomavirus (HPV): Secondary | ICD-10-CM | POA: Diagnosis not present

## 2019-07-24 DIAGNOSIS — Z720 Tobacco use: Secondary | ICD-10-CM

## 2019-07-24 DIAGNOSIS — Z01411 Encounter for gynecological examination (general) (routine) with abnormal findings: Secondary | ICD-10-CM | POA: Diagnosis not present

## 2019-07-24 DIAGNOSIS — Z0000 Encounter for general adult medical examination without abnormal findings: Secondary | ICD-10-CM | POA: Diagnosis not present

## 2019-07-24 DIAGNOSIS — A749 Chlamydial infection, unspecified: Secondary | ICD-10-CM | POA: Diagnosis not present

## 2019-07-24 DIAGNOSIS — Z975 Presence of (intrauterine) contraceptive device: Secondary | ICD-10-CM | POA: Insufficient documentation

## 2019-07-24 DIAGNOSIS — Z30431 Encounter for routine checking of intrauterine contraceptive device: Secondary | ICD-10-CM | POA: Insufficient documentation

## 2019-07-24 NOTE — Patient Instructions (Signed)
Health Maintenance, Female Adopting a healthy lifestyle and getting preventive care are important in promoting health and wellness. Ask your health care provider about:  The right schedule for you to have regular tests and exams.  Things you can do on your own to prevent diseases and keep yourself healthy. What should I know about diet, weight, and exercise? Eat a healthy diet   Eat a diet that includes plenty of vegetables, fruits, low-fat dairy products, and lean protein.  Do not eat a lot of foods that are high in solid fats, added sugars, or sodium. Maintain a healthy weight Body mass index (BMI) is used to identify weight problems. It estimates body fat based on height and weight. Your health care provider can help determine your BMI and help you achieve or maintain a healthy weight. Get regular exercise Get regular exercise. This is one of the most important things you can do for your health. Most adults should:  Exercise for at least 150 minutes each week. The exercise should increase your heart rate and make you sweat (moderate-intensity exercise).  Do strengthening exercises at least twice a week. This is in addition to the moderate-intensity exercise.  Spend less time sitting. Even light physical activity can be beneficial. Watch cholesterol and blood lipids Have your blood tested for lipids and cholesterol at 24 years of age, then have this test every 5 years. Have your cholesterol levels checked more often if:  Your lipid or cholesterol levels are high.  You are older than 24 years of age.  You are at high risk for heart disease. What should I know about cancer screening? Depending on your health history and family history, you may need to have cancer screening at various ages. This may include screening for:  Breast cancer.  Cervical cancer.  Colorectal cancer.  Skin cancer.  Lung cancer. What should I know about heart disease, diabetes, and high blood  pressure? Blood pressure and heart disease  High blood pressure causes heart disease and increases the risk of stroke. This is more likely to develop in people who have high blood pressure readings, are of African descent, or are overweight.  Have your blood pressure checked: ? Every 3-5 years if you are 18-39 years of age. ? Every year if you are 40 years old or older. Diabetes Have regular diabetes screenings. This checks your fasting blood sugar level. Have the screening done:  Once every three years after age 40 if you are at a normal weight and have a low risk for diabetes.  More often and at a younger age if you are overweight or have a high risk for diabetes. What should I know about preventing infection? Hepatitis B If you have a higher risk for hepatitis B, you should be screened for this virus. Talk with your health care provider to find out if you are at risk for hepatitis B infection. Hepatitis C Testing is recommended for:  Everyone born from 1945 through 1965.  Anyone with known risk factors for hepatitis C. Sexually transmitted infections (STIs)  Get screened for STIs, including gonorrhea and chlamydia, if: ? You are sexually active and are younger than 24 years of age. ? You are older than 24 years of age and your health care provider tells you that you are at risk for this type of infection. ? Your sexual activity has changed since you were last screened, and you are at increased risk for chlamydia or gonorrhea. Ask your health care provider if   you are at risk.  Ask your health care provider about whether you are at high risk for HIV. Your health care provider may recommend a prescription medicine to help prevent HIV infection. If you choose to take medicine to prevent HIV, you should first get tested for HIV. You should then be tested every 3 months for as long as you are taking the medicine. Pregnancy  If you are about to stop having your period (premenopausal) and  you may become pregnant, seek counseling before you get pregnant.  Take 400 to 800 micrograms (mcg) of folic acid every day if you become pregnant.  Ask for birth control (contraception) if you want to prevent pregnancy. Osteoporosis and menopause Osteoporosis is a disease in which the bones lose minerals and strength with aging. This can result in bone fractures. If you are 65 years old or older, or if you are at risk for osteoporosis and fractures, ask your health care provider if you should:  Be screened for bone loss.  Take a calcium or vitamin D supplement to lower your risk of fractures.  Be given hormone replacement therapy (HRT) to treat symptoms of menopause. Follow these instructions at home: Lifestyle  Do not use any products that contain nicotine or tobacco, such as cigarettes, e-cigarettes, and chewing tobacco. If you need help quitting, ask your health care provider.  Do not use street drugs.  Do not share needles.  Ask your health care provider for help if you need support or information about quitting drugs. Alcohol use  Do not drink alcohol if: ? Your health care provider tells you not to drink. ? You are pregnant, may be pregnant, or are planning to become pregnant.  If you drink alcohol: ? Limit how much you use to 0-1 drink a day. ? Limit intake if you are breastfeeding.  Be aware of how much alcohol is in your drink. In the [place] one drink equals one 12 oz bottle of beer (355 mL), one 5 oz glass of wine (148 mL), or one 1 oz glass of hard liquor (44 mL). General instructions  Schedule regular health, dental, and eye exams.  Stay current with your vaccines.  Tell your health care provider if: ? You often feel depressed. ? You have ever been abused or do not feel safe at home. Summary  Adopting a healthy lifestyle and getting preventive care are important in promoting health and wellness.  Follow your health care provider's instructions about healthy  diet, exercising, and getting tested or screened for diseases.  Follow your health care provider's instructions on monitoring your cholesterol and blood pressure. This information is not intended to replace advice given to you by your health care provider. Make sure you discuss any questions you have with your health care provider. Document Revised: 03/13/2018 Document Reviewed: 03/13/2018 Elsevier Patient Education  2020 Elsevier Inc.  

## 2019-07-24 NOTE — Progress Notes (Addendum)
Christine Ramsey is a 24 y.o. G59P1001 female here for a routine annual gynecologic exam.  Current complaints: None.   Denies abnormal vaginal bleeding, discharge, pelvic pain, problems with intercourse or other gynecologic concerns.    Gynecologic History No LMP recorded. (Menstrual status: IUD). Contraception: IUD Last Pap: Uncertain.    Obstetric History OB History  Gravida Para Term Preterm AB Living  1 1 1  0 0 1  SAB TAB Ectopic Multiple Live Births  0 0 0 0 1    # Outcome Date GA Lbr Len/2nd Weight Sex Delivery Anes PTL Lv  1 Term 06/27/15 [redacted]w[redacted]d 02:25 / 00:19 7 lb 9.9 oz (3.456 kg) M Vag-Spont EPI  LIV    Past Medical History:  Diagnosis Date  . Drug abuse ([place])   . Scoliosis   . Scoliosis     Past Surgical History:  Procedure Laterality Date  . ADENOIDECTOMY    . BACK SURGERY      Current Outpatient Medications on File Prior to Visit  Medication Sig Dispense Refill  . acetaminophen (TYLENOL) 500 MG tablet Take 1,000 mg by mouth 2 (two) times daily.     . methadone (DOLOPHINE) 10 MG/ML solution Take 160 mg by mouth every morning.     . naproxen sodium (ALEVE) 220 MG tablet Take 440 mg by mouth 2 (two) times daily as needed (pain).     No current facility-administered medications on file prior to visit.    Allergies  Allergen Reactions  . Prednisone Other (See Comments)    Makes muscles ache.    Social History   Socioeconomic History  . Marital status: Single    Spouse name: Not on file  . Number of children: Not on file  . Years of education: Not on file  . Highest education level: Not on file  Occupational History  . Not on file  Tobacco Use  . Smoking status: Current Every Day Smoker    Packs/day: 0.25    Types: Cigarettes  . Smokeless tobacco: Never Used  Substance and Sexual Activity  . Alcohol use: No  . Drug use: Yes    Types: Heroin    Comment: xanax and methadone. 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 Sexual activity:  Never  Other Topics Concern  . Not on file  Social History Narrative   Lives at [facility] (Caprice Red) who is also her legal guardian   Social Determinants of Health   Financial Resource Strain:   . Difficulty of Paying Living Expenses:   Food Insecurity:   . Worried About Charity fundraiser in the Last Year:   . Arboriculturist in the Last Year:   Transportation Needs:   . Film/video editor (Medical):   Marland Kitchen Lack of Transportation (Non-Medical):   Physical Activity:   . Days of Exercise per Week:   . Minutes of Exercise per Session:   Stress:   . Feeling of Stress :   Social Connections:   . Frequency of Communication with Friends and Family:   . Frequency of Social Gatherings with Friends and Family:   . Attends Religious Services:   . Active Member of Clubs or Organizations:   . Attends Archivist Meetings:   Marland Kitchen Marital Status:   Intimate Partner Violence:   . Fear of Current or Ex-Partner:   . Emotionally Abused:   Marland Kitchen Physically Abused:   . Sexually Abused:     Family History  Problem  Relation Age of Onset  . Suicidality Father        Successfully commited suicide  . Suicidality Mother        attempted multiple times  . Bipolar disorder Mother   . Depression Mother   . Alcohol abuse Neg Hx   . Diabetes Mellitus II Neg Hx   . Heart attack Neg Hx     The following portions of the patient's history were reviewed and updated as appropriate: allergies, current medications, past family history, past medical history, past social history, past surgical history and problem list.  Review of Systems Pertinent items noted in HPI and remainder of comprehensive ROS otherwise negative.   Objective:  BP 106/72   Pulse 67   Ht 5\' 7"  (1.702 m)   Wt 234 lb 4.8 oz (106.3 kg)   BMI 36.70 kg/m  CONSTITUTIONAL: Well-developed, well-nourished female in no acute distress.  HENT:  Normocephalic, atraumatic, External right and left ear normal. Oropharynx  is clear and moist EYES: Conjunctivae and EOM are normal. Pupils are equal, round, and reactive to light. No scleral icterus.  NECK: Normal range of motion, supple, no masses.  Normal thyroid.  SKIN: Skin is warm and dry. No rash noted. Not diaphoretic. No erythema. No pallor. [place]: Alert and oriented to person, place, and time. Normal reflexes, muscle tone coordination. No cranial nerve deficit noted. PSYCHIATRIC: Normal mood and affect. Normal behavior. Normal judgment and thought content. CARDIOVASCULAR: Normal heart rate noted, regular rhythm RESPIRATORY: Clear to auscultation bilaterally. Effort and breath sounds normal, no problems with respiration noted. BREASTS: Symmetric in size. No masses, skin changes, nipple drainage, or lymphadenopathy. ABDOMEN: Soft, normal bowel sounds, no distention noted.  No tenderness, rebound or guarding.  PELVIC: Normal appearing external genitalia; normal appearing vaginal mucosa and cervix.  No abnormal discharge noted. IUD string noted. Pap smear obtained.  Normal uterine size, no other palpable masses, no uterine or adnexal tenderness. MUSCULOSKELETAL: Normal range of motion. No tenderness.  No cyanosis, clubbing, or edema.  2+ distal pulses.   Assessment:  Annual gynecologic examination with pap smear IUD check STD exposure Plan:  Will follow up results of pap smear and manage accordingly. STD testing per pt request, f/u per results Routine preventative health maintenance measures emphasized. Please refer to After Visit Summary for other counseling recommendations.    Chancy Milroy, MD, [place] Attending [place] for [facility], [place]

## 2019-07-25 LAB — CYTOLOGY - PAP
Chlamydia: POSITIVE — AB
Comment: NEGATIVE
Comment: NORMAL
Diagnosis: NEGATIVE
Neisseria Gonorrhea: NEGATIVE

## 2019-07-25 LAB — HEPATITIS PANEL, ACUTE
Hep A IgM: NEGATIVE
Hep B C IgM: NEGATIVE
Hep C Virus Ab: <0.1 s/co ratio (ref 0.0–0.9)
Hepatitis B Surface Ag: NEGATIVE

## 2019-07-25 LAB — HIV ANTIBODY (ROUTINE TESTING W REFLEX): HIV Screen 4th Generation wRfx: NONREACTIVE

## 2019-07-25 LAB — RPR: RPR Ser Ql: NONREACTIVE

## 2019-07-28 ENCOUNTER — Telehealth: Payer: Self-pay

## 2019-07-28 ENCOUNTER — Encounter: Payer: Self-pay | Admitting: Obstetrics and Gynecology

## 2019-07-28 ENCOUNTER — Other Ambulatory Visit: Payer: Self-pay

## 2019-07-28 DIAGNOSIS — A749 Chlamydial infection, unspecified: Secondary | ICD-10-CM

## 2019-07-28 HISTORY — DX: Chlamydial infection, unspecified: A74.9

## 2019-07-28 MED ORDER — DOXYCYCLINE HYCLATE 100 MG PO TABS: 100.0000 mg | ORAL_TABLET | Freq: Two times a day (BID) | ORAL | Status: DC

## 2019-07-28 NOTE — Telephone Encounter (Signed)
Called pt to advise of + Chlamydia test result, no answer left message on # ending [phone] to call back. Could not leave a message on # ending [phone]. Sent Rx for Doxycycline to pharmacy on file.

## 2019-07-28 NOTE — Telephone Encounter (Signed)
-----   Message from Chancy Milroy, MD sent at 07/28/2019  5:27 AM EDT ----- Please let Ms Christine Ramsey know that her pa smear was normal. All STD testing was negative except for chlamydia. Please treat as per protocol. Advise her to have her partner treated as well. TOC 3-4 weeks after treatment is completed.   Thanks Legrand Como

## 2019-07-29 ENCOUNTER — Telehealth: Payer: Self-pay

## 2019-07-29 NOTE — Telephone Encounter (Signed)
Pt called Nurse line returning call regarding testing + for Chlamydia & that Rx has been sent to pharmacy. Left VM stating that she can give [place] permission to leave test results on VM.

## 2019-07-29 NOTE — Telephone Encounter (Signed)
Called pt today still no answer, left VM for a call back regarding her test results & Rx sent to pharmacy.

## 2019-07-30 ENCOUNTER — Telehealth: Payer: Self-pay

## 2019-07-30 NOTE — Telephone Encounter (Signed)
Pt called for results.  Attempted to call pt and received message that the person you are trying to call is not available please try call back later.

## 2019-08-15 IMAGING — US IR BIOPSY CORE MUSCLE/SOFT TISSUE
1 series · 11 of 11 positions shown · non-contrast
Comparison: none

INDICATION: 22-year-old with history of a left neck lump. Patient had an
ultrasound approximately 1 month ago that demonstrated a complex
cystic structure at this location. Patient continues to have
fullness in this area but it has markedly decreased following
antibiotics. Patient presents for ultrasound-guided sampling of this
area.

[Series 1: ir biopsy core muscle/soft tissue · 0.06mm/px · 11 of 11 slices shown]
[im 1/11]
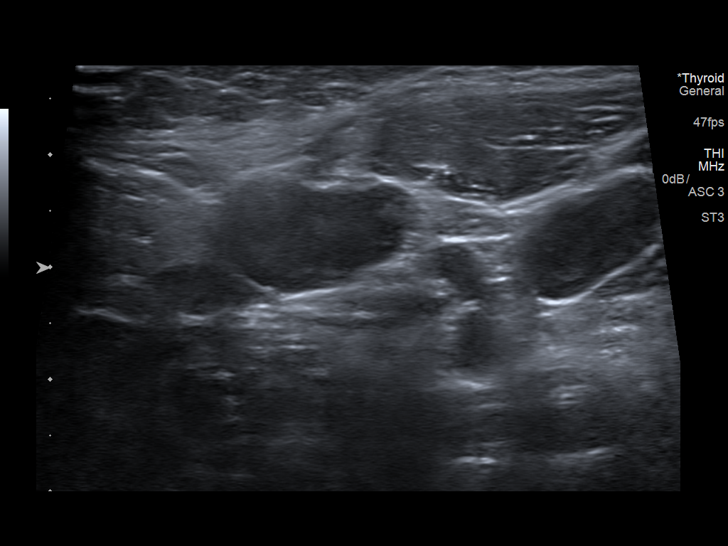
[im 2/11]
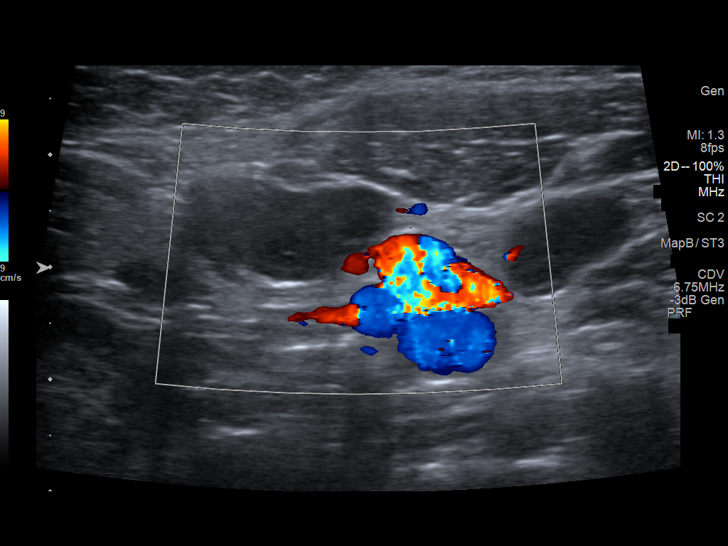
[im 3/11]
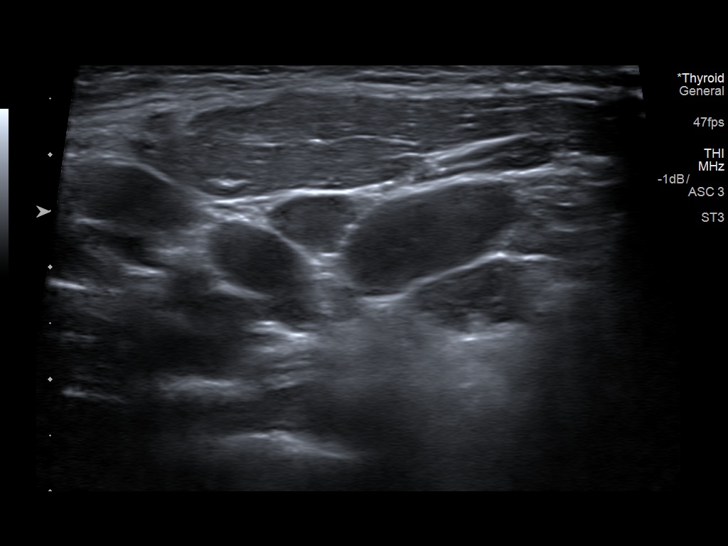
[im 4/11]
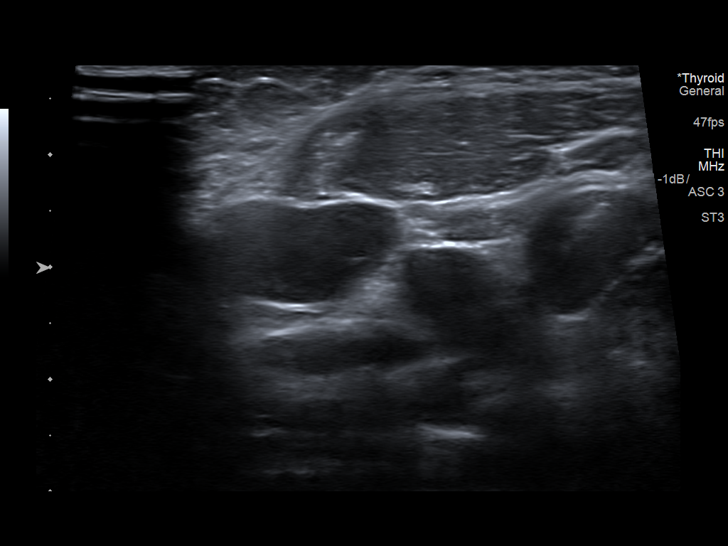
[im 5/11]
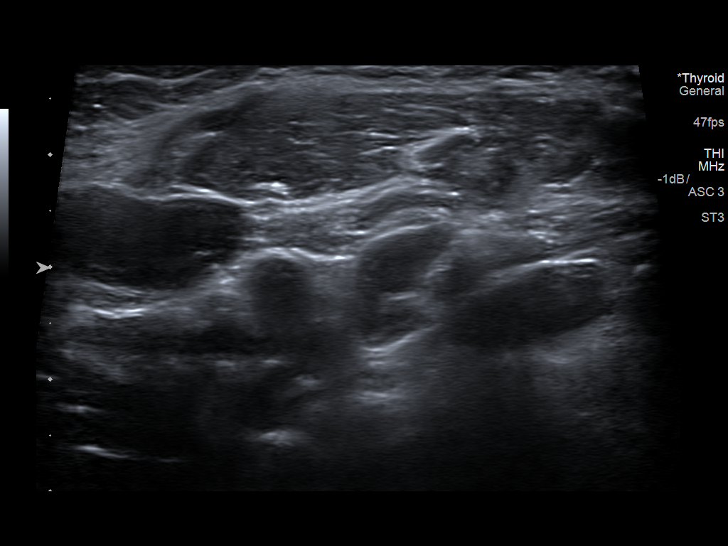
[im 6/11]
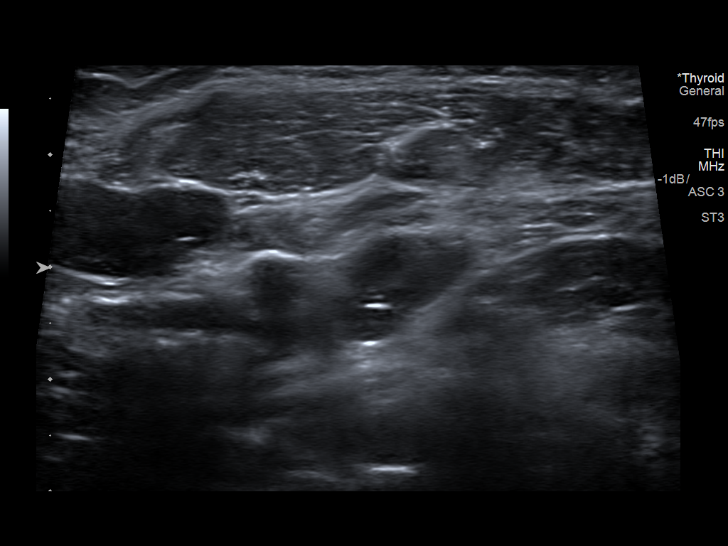
[im 7/11]
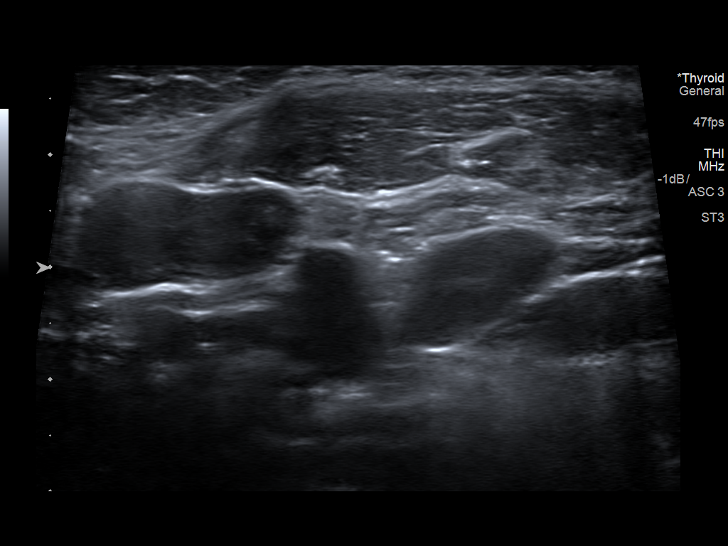
[im 8/11]
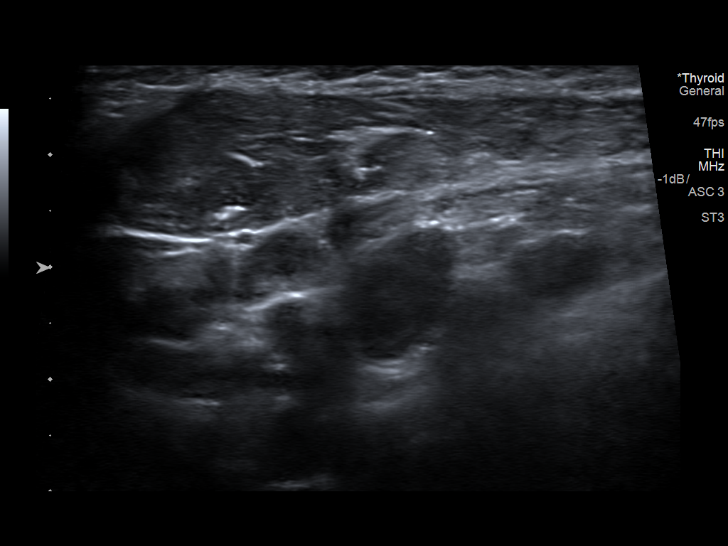
[im 9/11]
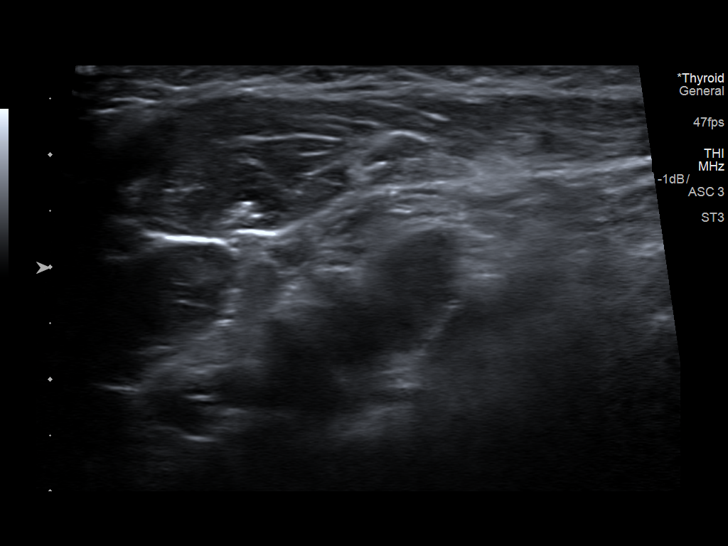
[im 10/11]
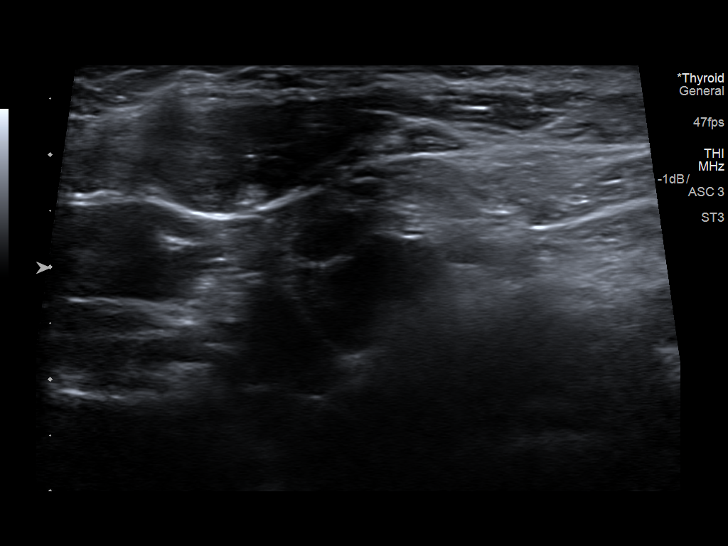
[im 11/11]
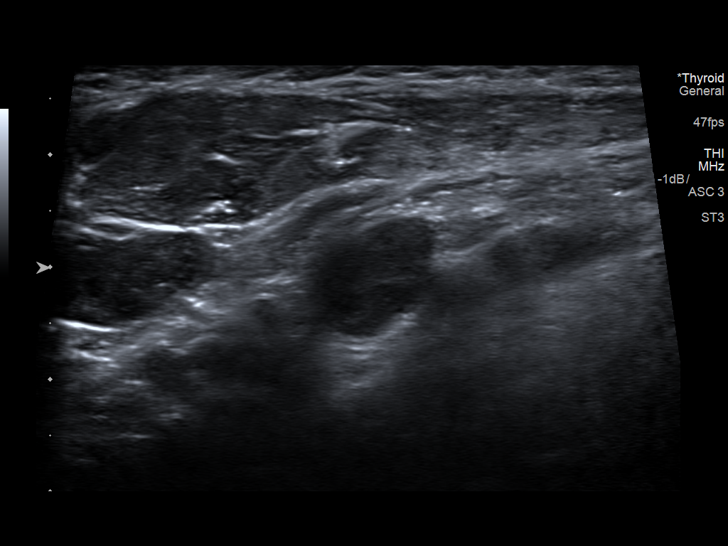

[11 of 11 positions shown; findings below may reference images not displayed]

EXAM:
ULTRASOUND-GUIDED CORE BIOPSY OF A LEFT CERVICAL LYMPH NODE

MEDICATIONS:
None.

ANESTHESIA/SEDATION:
Moderate (conscious) sedation was employed during this procedure. A
total of Versed 2.0 mg and Fentanyl 100 mcg was administered
intravenously.

Moderate Sedation Time: 25 minutes. The patient's level of
consciousness and vital signs were monitored continuously by
radiology nursing throughout the procedure under my direct
supervision.

FLUOROSCOPY TIME:  None

COMPLICATIONS:
None immediate.

PROCEDURE:
Left side of the neck was evaluated with ultrasound. A complex
cystic lesion was not identified but there are prominent hypoechoic
lymph nodes throughout this area. I discussed these ultrasound
findings with the patient. I suspect that the lymph nodes are
reactive but she has been having fullness in this area for a few
months, therefore, the lymph nodes are indeterminate. We decided to
proceed with a biopsy of 1 of the more prominent lymph nodes.

Informed written consent was obtained from the patient after a
thorough discussion of the procedural risks, benefits and
alternatives. All questions were addressed. A timeout was performed
prior to the initiation of the procedure.

Left side of the neck was prepped with chlorhexidine and sterile
field was created. Skin and soft tissues were anesthetized with 1%
lidocaine. Using ultrasound guidance, 18 gauge core device was
directed into a lymph node lateral to the left internal jugular
vein. A total of 4 core biopsies were obtained. Specimens placed in
saline. Bandage placed over the puncture site.
FINDINGS: No large solid or cystic mass in the left neck. However, there are
multiple enlarged hypoechoic lymph nodes throughout the left upper
neck. Lymph node lateral to the left internal jugular vein was
sampled. No bleeding or hematoma formation following the core
biopsies.
IMPRESSION: Ultrasound-guided core biopsy of a prominent left cervical lymph
node.

The previously seen large cystic lesion in the left neck has
resolved.

## 2019-08-18 ENCOUNTER — Telehealth: Payer: Self-pay

## 2019-08-18 DIAGNOSIS — A749 Chlamydial infection, unspecified: Secondary | ICD-10-CM

## 2019-08-18 MED ORDER — DOXYCYCLINE HYCLATE 100 MG PO CAPS: 100.0000 mg | ORAL_CAPSULE | Freq: Two times a day (BID) | ORAL | 0 refills | Status: DC

## 2019-08-18 NOTE — Telephone Encounter (Addendum)
Pt left VM message @ [phone] stating that she is returning our call to get her test results. She further stated that we have been calling her grandmother's number [phone]). Pt requested to be called at the following number [phone]). I called pt and informed her of +Chlamydia results from Pap on 4/22. Rx has been sent to her pharmacy. Pt advised that her partner will require treatment as well and they should not have sex until 2 weeks after both have been treated. Pt states she has not had sex since august 2020. Pt was also advised of need for TOC 3-4 weeks after her treatment is finished. She will need to call the office to schedule a nurse visit @ that time. Pt was also informed that all other tests performed on 4/22 were negative. Pt voiced understanding of all information and instructions given.

## 2019-08-18 NOTE — Telephone Encounter (Signed)
Pt called after hours and spoke with answering service. AccessNurse note as follows: "caller wants to get her test results, states she has called before and still waiting." Per chart review, multiple attempts to contact pt have been made by clinical staff. Pt does not have active MyChart account.   Called pt at mobile number; call cannot be completed. Called home number, also listed as pt contact number; VM left stating I am returned patient's phone call. Callback number given.

## 2019-08-18 NOTE — Addendum Note (Signed)
Addended by: Louisa Second E on: 08/18/2019 04:40 PM   Modules accepted: Orders

## 2019-09-19 ENCOUNTER — Other Ambulatory Visit: Payer: Self-pay

## 2019-09-19 ENCOUNTER — Other Ambulatory Visit (HOSPITAL_COMMUNITY)
Admission: RE | Admit: 2019-09-19 | Discharge: 2019-09-19 | Disposition: A | Discharge status: Home / Self Care | Payer: Medicaid Other | Source: Ambulatory Visit | Attending: Family Medicine | Admitting: Family Medicine

## 2019-09-19 ENCOUNTER — Encounter: Payer: Self-pay | Admitting: *Deleted

## 2019-09-19 ENCOUNTER — Ambulatory Visit (INDEPENDENT_AMBULATORY_CARE_PROVIDER_SITE_OTHER): Payer: Medicaid Other | Admitting: *Deleted

## 2019-09-19 VITALS — BP 106/71 | HR 69 | Ht 66.0 in | Wt 233.1 lb

## 2019-09-19 DIAGNOSIS — Z202 Contact with and (suspected) exposure to infections with a predominantly sexual mode of transmission: Secondary | ICD-10-CM | POA: Diagnosis present

## 2019-09-19 NOTE — Progress Notes (Signed)
Pt presents for CT test of cure. She received Doxycycline on 5/17 and states she has completed the medication. Self swab obtained. Pt will be notified of results via MyChart and voiced understanding.

## 2019-09-19 NOTE — Progress Notes (Signed)
Patient was assessed and managed by nursing staff during this encounter. I have reviewed the chart and agree with the documentation and plan.  Kerry Hough, PA-C 09/19/2019 2:26 PM

## 2019-09-22 LAB — CERVICOVAGINAL ANCILLARY ONLY
Chlamydia: NEGATIVE
Comment: NEGATIVE
Comment: NORMAL
Neisseria Gonorrhea: NEGATIVE

## 2019-11-02 IMAGING — US US OUTSIDE FILMS BODY
1 series · 14 of 16 positions shown · non-contrast
Comparison: none

[Series 1: us outside films body · 0.06mm/px · 14 of 36 slices shown]
[im 1/36]
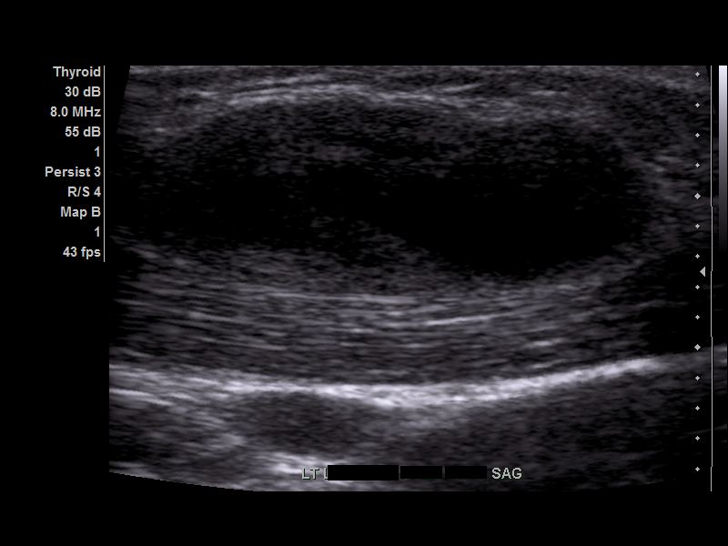
[im 3/36]
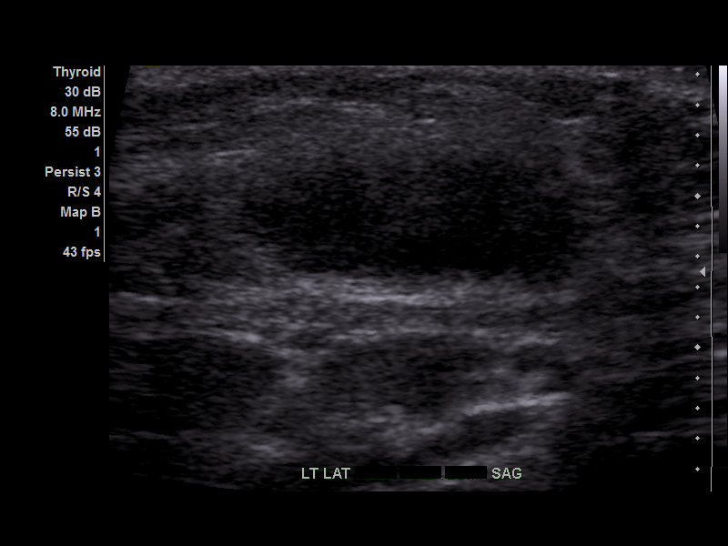
[im 5/36]
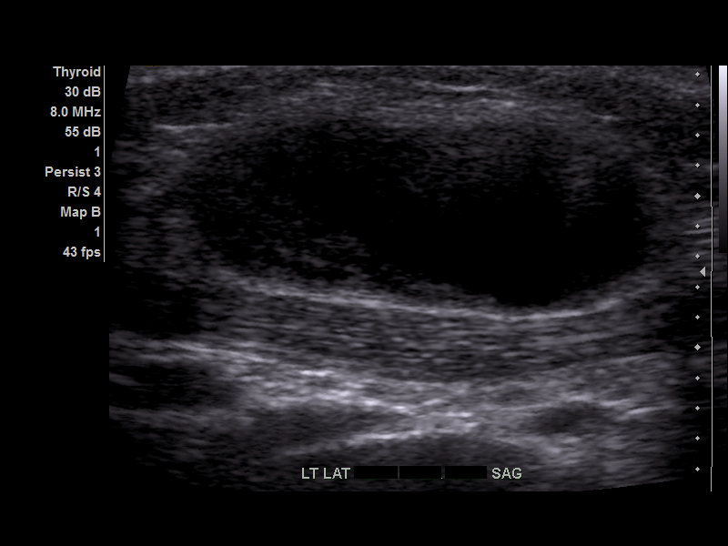
[im 10/36]
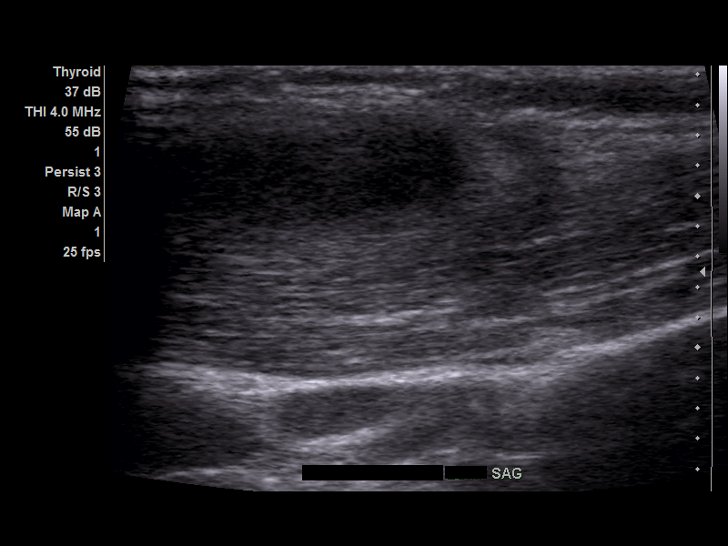
[im 12/36]
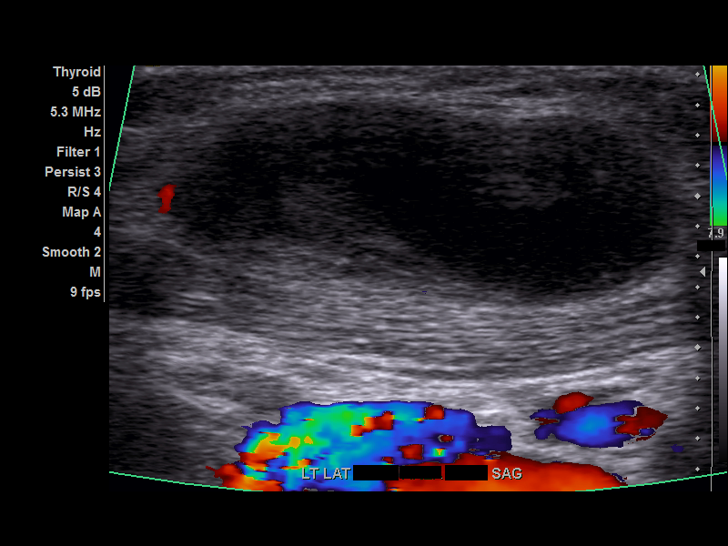
[im 15/36]
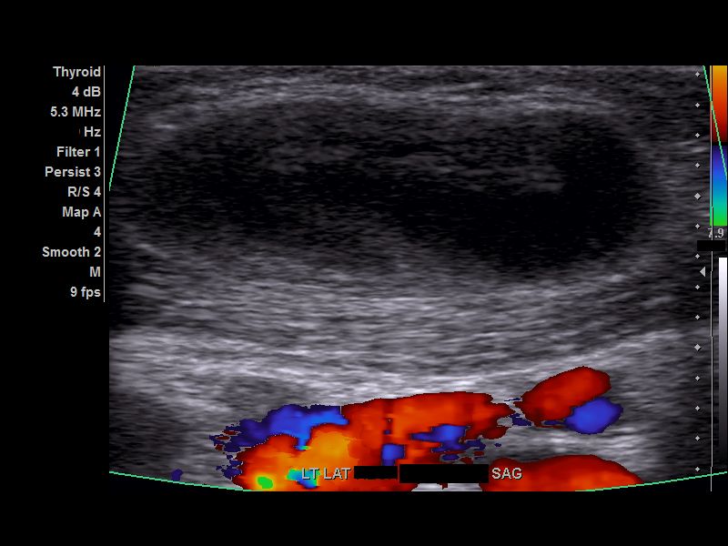
[im 17/36]
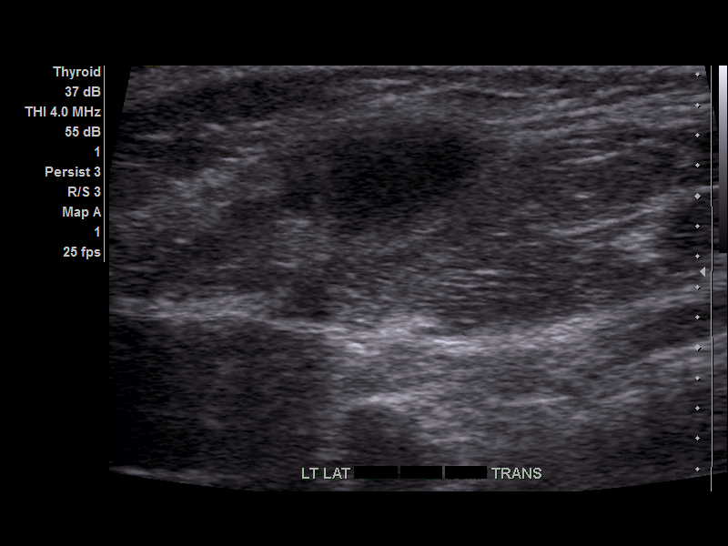
[im 19/36]
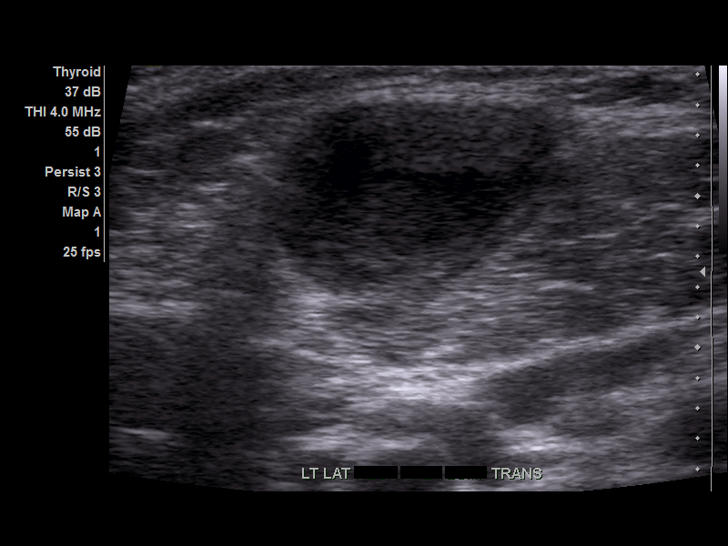
[im 22/36]
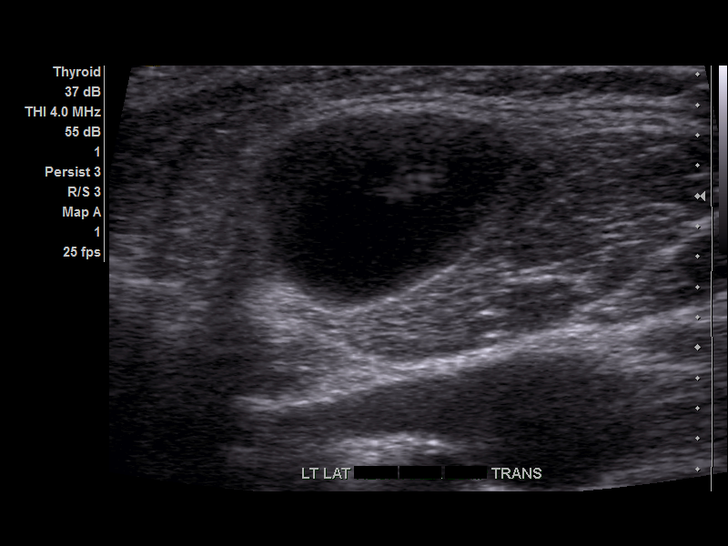
[im 24/36]
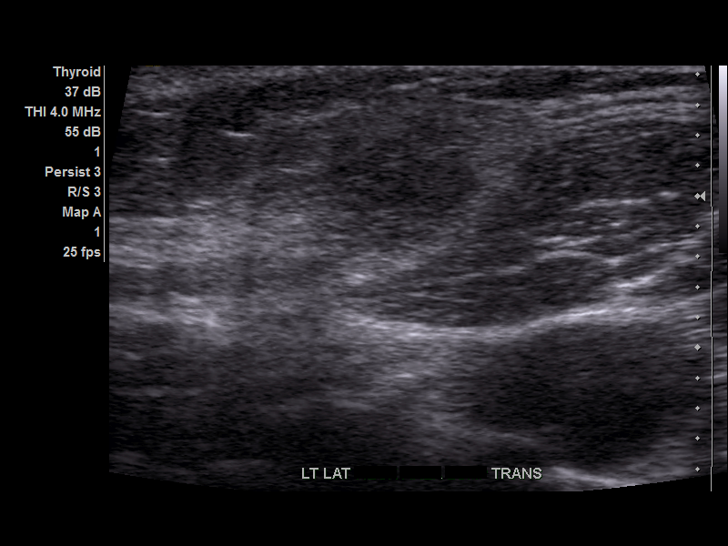
[im 29/36]
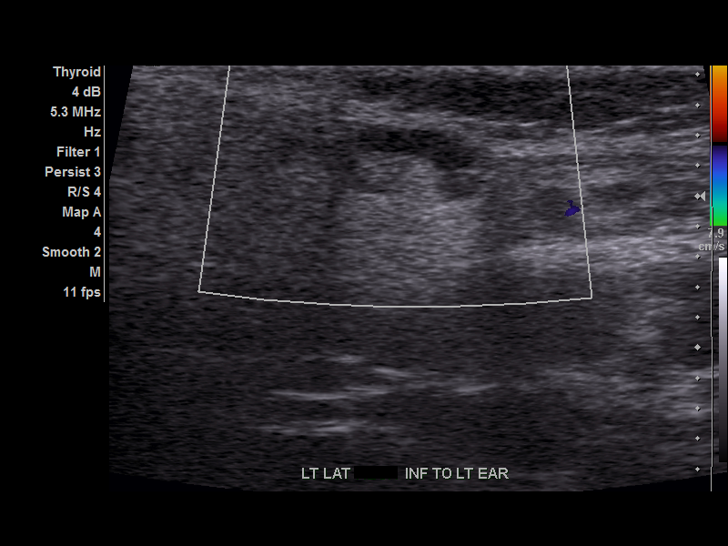
[im 31/36]
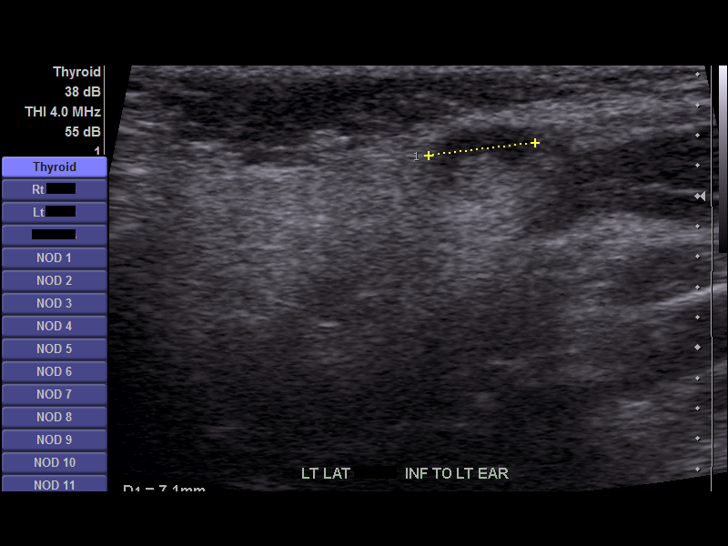
[im 33/36]
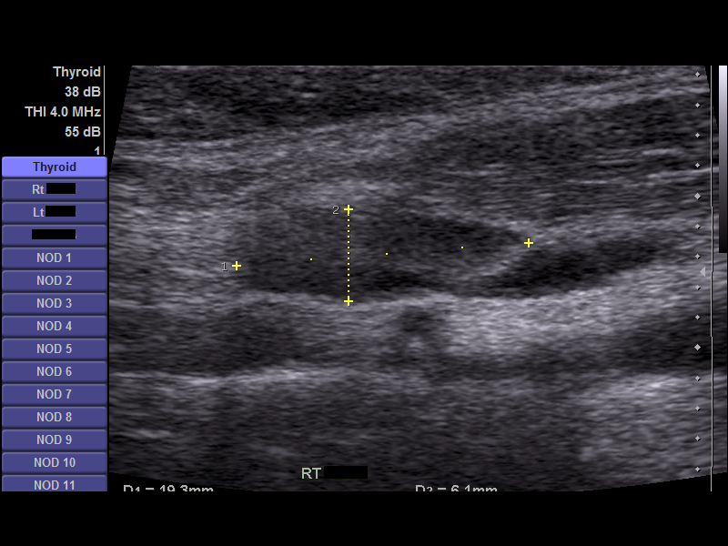
[im 36/36]
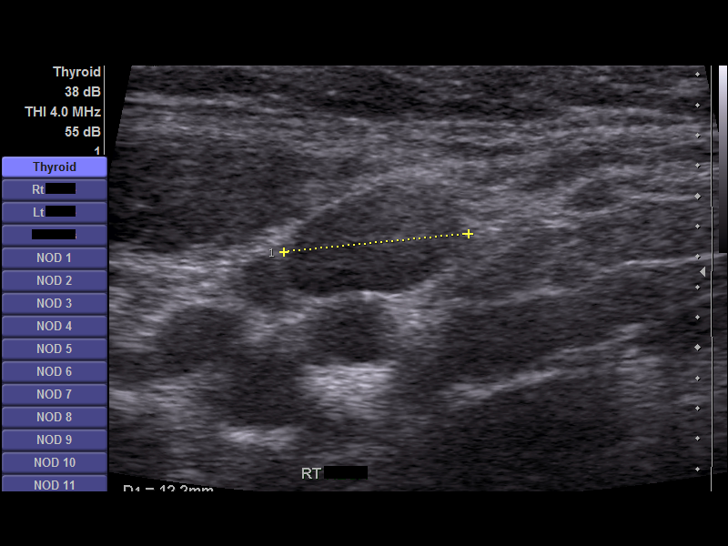

[14 of 16 positions shown; findings below may reference images not displayed]

Canned report from images found in remote index.

Refer to host system for actual result text.

## 2019-12-16 ENCOUNTER — Telehealth: Payer: Self-pay

## 2019-12-16 NOTE — Telephone Encounter (Signed)
Pt called to confirm results from [facility] swab in June 2021 were negative. Results reviewed with pt. Pt would like to schedule nurse visit for STD testing. [place] office notified to schedule appt.

## 2019-12-29 ENCOUNTER — Ambulatory Visit (INDEPENDENT_AMBULATORY_CARE_PROVIDER_SITE_OTHER): Payer: Medicaid Other | Admitting: General Practice

## 2019-12-29 ENCOUNTER — Other Ambulatory Visit (HOSPITAL_COMMUNITY)
Admission: RE | Admit: 2019-12-29 | Discharge: 2019-12-29 | Disposition: A | Discharge status: Home / Self Care | Payer: Medicaid Other | Source: Ambulatory Visit | Attending: Family Medicine | Admitting: Family Medicine

## 2019-12-29 ENCOUNTER — Other Ambulatory Visit: Payer: Self-pay

## 2019-12-29 VITALS — BP 114/61 | HR 72 | Ht 67.0 in | Wt 232.0 lb

## 2019-12-29 DIAGNOSIS — Z113 Encounter for screening for infections with a predominantly sexual mode of transmission: Secondary | ICD-10-CM

## 2019-12-29 NOTE — Progress Notes (Signed)
Patient was assessed and managed by nursing staff during this encounter. I have reviewed the chart and agree with the documentation and plan. I have also made any necessary editorial changes.  Verita Schneiders, MD 12/29/2019 12:03 PM

## 2019-12-29 NOTE — Progress Notes (Signed)
Patient presents to office today requesting full STD testing. Reports history of chlamydia earlier this year and has recently had intercourse with a new partner. Patient was instructed in self swab & specimen was collected. Blood work drawn today as well. Discussed results will be back in 24-48 hours and will be available in mychart. Patient verbalized understanding.  Koren Bound RN BSN 12/29/19

## 2019-12-30 LAB — HEPATITIS B SURFACE ANTIGEN: Hepatitis B Surface Ag: NEGATIVE

## 2019-12-30 LAB — CERVICOVAGINAL ANCILLARY ONLY
Chlamydia: NEGATIVE
Comment: NEGATIVE
Comment: NEGATIVE
Comment: NORMAL
Neisseria Gonorrhea: NEGATIVE
Trichomonas: NEGATIVE

## 2019-12-30 LAB — HEPATITIS C ANTIBODY: Hep C Virus Ab: <0.1 s/co ratio (ref 0.0–0.9)

## 2019-12-30 LAB — RPR: RPR Ser Ql: NONREACTIVE

## 2019-12-30 LAB — HIV ANTIBODY (ROUTINE TESTING W REFLEX): HIV Screen 4th Generation wRfx: NONREACTIVE

## 2020-06-17 ENCOUNTER — Encounter: Payer: Self-pay | Admitting: Certified Nurse Midwife

## 2020-06-17 ENCOUNTER — Other Ambulatory Visit: Payer: Self-pay

## 2020-06-17 ENCOUNTER — Ambulatory Visit (INDEPENDENT_AMBULATORY_CARE_PROVIDER_SITE_OTHER): Payer: Medicaid Other | Admitting: Certified Nurse Midwife

## 2020-06-17 ENCOUNTER — Other Ambulatory Visit (HOSPITAL_COMMUNITY)
Admission: RE | Admit: 2020-06-17 | Discharge: 2020-06-17 | Disposition: A | Discharge status: Home / Self Care | Payer: Medicaid Other | Source: Ambulatory Visit | Attending: Certified Nurse Midwife | Admitting: Certified Nurse Midwife

## 2020-06-17 VITALS — BP 106/49 | HR 71 | Wt 211.0 lb

## 2020-06-17 DIAGNOSIS — Z113 Encounter for screening for infections with a predominantly sexual mode of transmission: Secondary | ICD-10-CM | POA: Diagnosis present

## 2020-06-17 DIAGNOSIS — Z01419 Encounter for gynecological examination (general) (routine) without abnormal findings: Secondary | ICD-10-CM

## 2020-06-17 NOTE — Progress Notes (Signed)
Patient is here today to get full STD screening but does not have any symptoms

## 2020-06-17 NOTE — Progress Notes (Signed)
GYNECOLOGY CLINIC ANNUAL PREVENTATIVE CARE ENCOUNTER NOTE  Subjective:   Christine Ramsey is a 25 y.o. G61P1001 female here for a routine annual gynecologic exam.  Current complaints: Partner had unprotected sex with another female and pt desires full STI panel. She does not have any symptoms. Only other complaints are fatigue which she thinks may be due to her methadone treatment, as well as occasional mastalgia which she says seems connected to sexual activity (always within 24hrs after and then fades).   Denies abnormal vaginal bleeding, discharge, pelvic pain, problems with intercourse or other gynecologic concerns.    Gynecologic History No LMP recorded. (Menstrual status: IUD). Contraception: IUD Last Pap: 2021. Results were: normal Last mammogram: N/A.   Obstetric History OB History  Gravida Para Term Preterm AB Living  1 1 1  0 0 1  SAB IAB Ectopic Multiple Live Births  0 0 0 0 1    # Outcome Date GA Lbr Len/2nd Weight Sex Delivery Anes PTL Lv  1 Term 06/27/15 [redacted]w[redacted]d 02:25 / 00:19 7 lb 9.9 oz (3.456 kg) M Vag-Spont EPI  LIV    Past Medical History:  Diagnosis Date  . Drug abuse ([place])   . Scoliosis   . Scoliosis     Past Surgical History:  Procedure Laterality Date  . ADENOIDECTOMY    . BACK SURGERY      Current Outpatient Medications on File Prior to Visit  Medication Sig Dispense Refill  . acetaminophen (TYLENOL) 500 MG tablet Take 1,000 mg by mouth 2 (two) times daily.    Marland Kitchen ibuprofen (ADVIL) 200 MG tablet Take 200 mg by mouth every 6 (six) hours as needed.    . methadone (DOLOPHINE) 10 MG/ML solution Take 100 mg by mouth every morning.      No current facility-administered medications on file prior to visit.    Allergies  Allergen Reactions  . Prednisone Other (See Comments)    Makes muscles ache.    Social History   Socioeconomic History  . Marital status: Single    Spouse name: Not on file  . Number of children: Not on file  . Years of education:  Not on file  . Highest education level: Not on file  Occupational History  . Not on file  Tobacco Use  . Smoking status: Current Every Day Smoker    Packs/day: 0.50    Types: Cigarettes  . Smokeless tobacco: Never Used  Vaping Use  . Vaping Use: Never used  Substance and Sexual Activity  . Alcohol use: Yes    Comment: occ  . Drug use: Not Currently    Types: Heroin, Marijuana    Comment: xanax and methadone. 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 Sexual activity: Yes    Birth control/protection: I.U.D.  Other Topics Concern  . Not on file  Social History Narrative   Lives at [facility] ([facility]rice Red) who is also her legal guardian   Social Determinants of Health   Financial Resource Strain: Not on file  Food Insecurity: No Food Insecurity  . Worried About Charity fundraiser in the Last Year: Never true  . Ran Out of Food in the Last Year: Never true  Transportation Needs: No Transportation Needs  . Lack of Transportation (Medical): No  . Lack of Transportation (Non-Medical): No  Physical Activity: Not on file  Stress: Not on file  Social Connections: Not on file  Intimate Partner Violence: Not on file    Family  History  Problem Relation Age of Onset  . Suicidality Father        Successfully commited suicide  . Suicidality Mother        attempted multiple times  . Bipolar disorder Mother   . Depression Mother   . Alcohol abuse Neg Hx   . Diabetes Mellitus II Neg Hx   . Heart attack Neg Hx     The following portions of the patient's history were reviewed and updated as appropriate: allergies, current medications, past family history, past medical history, past social history, past surgical history and problem list.  Review of Systems Pertinent items noted in HPI and remainder of comprehensive ROS otherwise negative.   Objective:  BP (!) 106/49   Pulse 71   Wt 211 lb (95.7 kg)   BMI 33.05 kg/m  CONSTITUTIONAL: Well-developed,  well-nourished female in no acute distress.  HENT:  Normocephalic, atraumatic EYES: Conjunctivae and EOM are normal. Pupils are equal, round, and reactive to light.  NECK: Normal range of motion, supple, no masses.  Normal thyroid.  SKIN: Skin is warm and dry. No rash noted. Not diaphoretic. No erythema. No pallor. [place]: Alert and oriented to person, place, and time. Normal reflexes, muscle tone coordination. No cranial nerve deficit noted. PSYCHIATRIC: Normal mood and affect. Normal behavior. Normal judgment and thought content. CARDIOVASCULAR: Normal heart rate noted, regular rhythm RESPIRATORY: Effort and breath sounds normal, no problems with respiration noted. BREASTS: Symmetric in size. No masses, skin changes, nipple drainage, or lymphadenopathy. ABDOMEN: Soft, normal bowel sounds, no distention noted.  No tenderness, rebound or guarding.  PELVIC: Normal appearing external genitalia; normal appearing vaginal mucosa and cervix.  No abnormal discharge noted.  Swabs obtained.  Normal uterine size, no other palpable masses, no uterine or adnexal tenderness. MUSCULOSKELETAL: Normal range of motion. No tenderness.  No cyanosis, clubbing, or edema.  2+ distal pulses.  Assessment:  Annual gynecologic examination with STI testing - will follow results and manage accordingly  Advised addition of multivitamin, nightly magnesium and calcium supplement for cyclical breast pain and overall health. Pt to follow up if breast pain gets worse or does not resolve.   Plan:  Routine preventative health maintenance measures emphasized. Please refer to After Visit Summary for other counseling recommendations.   Return PRN or for annual exam  Gabriel Carina, CNM

## 2020-06-18 LAB — CBC
Hematocrit: 43.8 % (ref 34.0–46.6)
Hemoglobin: 15.3 g/dL (ref 11.1–15.9)
MCH: 30.6 pg (ref 26.6–33.0)
MCHC: 34.9 g/dL (ref 31.5–35.7)
MCV: 88 fL (ref 79–97)
Platelets: 352 10*3/uL (ref 150–450)
RBC: 5 x10E6/uL (ref 3.77–5.28)
RDW: 11.8 % (ref 11.7–15.4)
WBC: 6.8 10*3/uL (ref 3.4–10.8)

## 2020-06-18 LAB — HEPATITIS C ANTIBODY: Hep C Virus Ab: <0.1 s/co ratio (ref 0.0–0.9)

## 2020-06-18 LAB — HEPATITIS B SURFACE ANTIGEN: Hepatitis B Surface Ag: NEGATIVE

## 2020-06-18 LAB — HIV ANTIBODY (ROUTINE TESTING W REFLEX): HIV Screen 4th Generation wRfx: NONREACTIVE

## 2020-06-18 LAB — RPR: RPR Ser Ql: NONREACTIVE

## 2020-06-21 LAB — CERVICOVAGINAL ANCILLARY ONLY
Chlamydia: NEGATIVE
Comment: NEGATIVE
Comment: NEGATIVE
Comment: NORMAL
Neisseria Gonorrhea: NEGATIVE
Trichomonas: NEGATIVE

## 2020-07-07 ENCOUNTER — Telehealth: Payer: Self-pay

## 2020-07-07 DIAGNOSIS — N644 Mastodynia: Secondary | ICD-10-CM

## 2020-07-07 MED ORDER — CALCIUM GLUCONATE 500 MG PO TABS: 2.0000 | ORAL_TABLET | Freq: Every day | ORAL | 6 refills | Status: DC

## 2020-07-07 MED ORDER — MAG-OXIDE 200 MG PO TABS: 400.0000 mg | ORAL_TABLET | Freq: Every day | ORAL | 3 refills | Status: DC

## 2020-07-07 NOTE — Telephone Encounter (Signed)
Pt left message on nurse VM. Returned call to pt.   Spoke with pt. Pt states requesting multivitamin, magnesium and calcium Rx for her cyclical breast pain that was mentioned at her appt on 06/17/20. Pt advised to listen for pharmacy when Rx ready. Pt agreeable and verbalized understanding. Pt has no further questions.   Routing to [place], CNM to send to pharmacy.  Pharmacy verified by pt   Colletta Maryland, RN  07/07/20.

## 2020-08-05 ENCOUNTER — Other Ambulatory Visit: Payer: Self-pay

## 2020-08-05 ENCOUNTER — Ambulatory Visit: Payer: Medicaid Other

## 2020-08-13 ENCOUNTER — Ambulatory Visit (INDEPENDENT_AMBULATORY_CARE_PROVIDER_SITE_OTHER): Payer: Medicaid Other | Admitting: *Deleted

## 2020-08-13 ENCOUNTER — Other Ambulatory Visit (HOSPITAL_COMMUNITY)
Admission: RE | Admit: 2020-08-13 | Discharge: 2020-08-13 | Disposition: A | Discharge status: Home / Self Care | Payer: Medicaid Other | Source: Ambulatory Visit | Attending: Family Medicine | Admitting: Family Medicine

## 2020-08-13 ENCOUNTER — Encounter: Payer: Self-pay | Admitting: *Deleted

## 2020-08-13 ENCOUNTER — Other Ambulatory Visit: Payer: Self-pay

## 2020-08-13 VITALS — BP 102/57 | HR 65 | Ht 66.0 in | Wt 206.3 lb

## 2020-08-13 DIAGNOSIS — Z87898 Personal history of other specified conditions: Secondary | ICD-10-CM

## 2020-08-13 DIAGNOSIS — Z9189 Other specified personal risk factors, not elsewhere classified: Secondary | ICD-10-CM

## 2020-08-13 LAB — POCT URINALYSIS DIP (DEVICE)
Glucose, UA: NEGATIVE mg/dL
Hgb urine dipstick: NEGATIVE
Leukocytes,Ua: NEGATIVE
Nitrite: NEGATIVE
Protein, ur: NEGATIVE mg/dL
Specific Gravity, Urine: 1.025 (ref 1.005–1.030)
Urobilinogen, UA: 0.2 mg/dL (ref 0.0–1.0)
pH: 5.5 (ref 5.0–8.0)

## 2020-08-13 NOTE — Progress Notes (Signed)
Chart reviewed for nurse visit. Agree with plan of care.   Luvenia Redden, PA-C 08/13/2020 11:27 AM

## 2020-08-13 NOTE — Progress Notes (Signed)
Pt desires testing for STI due to recent unprotected sex. She denies vaginal discharge or irritation. Pt also requests testing for possible UTI. She reports having recent dysuria and took OTC AZO. Her sx have subsided however she would like to be sure of no UTI. Urinalysis was performed and is negative for infection. Pt was instructed to increase po fluids due to mild dehydration. Self swab was obtained and pt was advised she will be notified of results and treatment indicated if any, via Mychart. Pt voiced understanding of all information and instructions given.

## 2020-08-16 LAB — CERVICOVAGINAL ANCILLARY ONLY
Chlamydia: NEGATIVE
Comment: NEGATIVE
Comment: NEGATIVE
Comment: NORMAL
Neisseria Gonorrhea: NEGATIVE
Trichomonas: NEGATIVE

## 2020-11-02 ENCOUNTER — Other Ambulatory Visit (HOSPITAL_COMMUNITY)
Admission: RE | Admit: 2020-11-02 | Discharge: 2020-11-02 | Disposition: A | Discharge status: Home / Self Care | Payer: Medicaid Other | Source: Ambulatory Visit | Attending: Family Medicine | Admitting: Family Medicine

## 2020-11-02 ENCOUNTER — Other Ambulatory Visit: Payer: Self-pay

## 2020-11-02 ENCOUNTER — Ambulatory Visit (INDEPENDENT_AMBULATORY_CARE_PROVIDER_SITE_OTHER): Payer: Medicaid Other

## 2020-11-02 VITALS — BP 116/56 | HR 76 | Ht 66.0 in | Wt 189.8 lb

## 2020-11-02 DIAGNOSIS — R300 Dysuria: Secondary | ICD-10-CM | POA: Diagnosis not present

## 2020-11-02 DIAGNOSIS — Z113 Encounter for screening for infections with a predominantly sexual mode of transmission: Secondary | ICD-10-CM | POA: Insufficient documentation

## 2020-11-02 LAB — POCT URINALYSIS DIP (DEVICE)
Bilirubin Urine: NEGATIVE
Glucose, UA: NEGATIVE mg/dL
Ketones, ur: NEGATIVE mg/dL
Leukocytes,Ua: NEGATIVE
Nitrite: NEGATIVE
Protein, ur: NEGATIVE mg/dL
Specific Gravity, Urine: >=1.03 (ref 1.005–1.030)
Urobilinogen, UA: 0.2 mg/dL (ref 0.0–1.0)
pH: 6 (ref 5.0–8.0)

## 2020-11-02 NOTE — Progress Notes (Signed)
Pt states having vaginal odor, frequency and urgency of urination for past 2-3 days. Pt states unsure if UTI or STD. Pt denies any exposure to STD and no vaginal discharge or itching. Will do UA in office today and send self swab. Will not treat today, pt aware to wait for results. Hx of Chlamydia in past.   UA today in office: negative. Will send for UC. Pt aware.   Self swab collected today. Pt advised results will take 24-48 hours and will see results in mychart and will be notified if needs further treatment. Pt verbalized understanding.   Colletta Maryland, RN

## 2020-11-03 LAB — CERVICOVAGINAL ANCILLARY ONLY
Bacterial Vaginitis (gardnerella): NEGATIVE
Candida Glabrata: NEGATIVE
Candida Vaginitis: NEGATIVE
Chlamydia: NEGATIVE
Comment: NEGATIVE
Comment: NEGATIVE
Comment: NEGATIVE
Comment: NEGATIVE
Comment: NEGATIVE
Comment: NORMAL
Neisseria Gonorrhea: NEGATIVE
Trichomonas: NEGATIVE

## 2020-11-03 LAB — HIV ANTIBODY (ROUTINE TESTING W REFLEX): HIV Screen 4th Generation wRfx: NONREACTIVE

## 2020-11-03 NOTE — Progress Notes (Signed)
Chart reviewed for nurse visit. Agree with plan of care.   Clarnce Flock, MD 11/03/20 7:37 AM

## 2020-11-04 LAB — URINE CULTURE

## 2021-06-08 ENCOUNTER — Encounter: Payer: Self-pay | Admitting: Certified Nurse Midwife

## 2021-06-13 ENCOUNTER — Telehealth: Payer: Self-pay | Admitting: General Practice

## 2021-06-13 NOTE — Telephone Encounter (Signed)
Called patient regarding mychart message. Patient states she is feeling better and the irritation/itching & discharge has gone away. She states what is concerning to her now is that she had sex on Saturday and it was quite painful the entire duration. Discussed with patient if she had a yeast infection with as much irritation as she was reporting and it hasn't fully gone away it can cause pain with intercourse. Recommended continuing monistat a few more days and waiting until next weekend before having sex again. Told patient to call us back if things worsen again. Patient verbalized understanding and was mentioning she has questions regarding her upcoming IUD removal and other options. Patient states she would like to get a period each month and isn't with the IUD. Reviewed with patient how IUD works and that not getting a period isn't harmful to her body. Discussed other options will not be as effective if she doesn't desire pregnancy. Discussed paragard could be an option but could cause heavier and more painful periods. Recommended [email or web address] to research other options as well. Patient verbalized understanding and will follow up on 3/29. ?

## 2021-06-29 ENCOUNTER — Ambulatory Visit: Payer: Medicaid Other | Admitting: Family Medicine

## 2021-08-02 ENCOUNTER — Ambulatory Visit: Payer: Medicaid Other | Admitting: Family Medicine

## 2022-07-12 ENCOUNTER — Other Ambulatory Visit (HOSPITAL_COMMUNITY)
Admission: RE | Admit: 2022-07-12 | Discharge: 2022-07-12 | Disposition: A | Discharge status: Home / Self Care | Payer: Medicaid Other | Source: Ambulatory Visit | Attending: Certified Nurse Midwife | Admitting: Certified Nurse Midwife

## 2022-07-12 ENCOUNTER — Other Ambulatory Visit: Payer: Self-pay

## 2022-07-12 ENCOUNTER — Ambulatory Visit (INDEPENDENT_AMBULATORY_CARE_PROVIDER_SITE_OTHER): Payer: Medicaid Other | Admitting: Certified Nurse Midwife

## 2022-07-12 ENCOUNTER — Encounter: Payer: Self-pay | Admitting: Certified Nurse Midwife

## 2022-07-12 VITALS — BP 125/68 | HR 81 | Ht 67.0 in | Wt 183.0 lb

## 2022-07-12 DIAGNOSIS — Z87898 Personal history of other specified conditions: Secondary | ICD-10-CM | POA: Diagnosis not present

## 2022-07-12 DIAGNOSIS — Z124 Encounter for screening for malignant neoplasm of cervix: Secondary | ICD-10-CM

## 2022-07-12 DIAGNOSIS — Z01419 Encounter for gynecological examination (general) (routine) without abnormal findings: Secondary | ICD-10-CM

## 2022-07-12 DIAGNOSIS — Z113 Encounter for screening for infections with a predominantly sexual mode of transmission: Secondary | ICD-10-CM

## 2022-07-12 DIAGNOSIS — Z0000 Encounter for general adult medical examination without abnormal findings: Secondary | ICD-10-CM

## 2022-07-12 NOTE — Progress Notes (Signed)
ANNUAL EXAM Patient name: Christine Ramsey MRN [id number]  Date of birth: 1996-02-17 Chief Complaint:   Gynecologic Exam  History of Present Illness:   Christine Ramsey is a 27 y.o. G53P1001 female of [place] ancestry being seen today for a routine annual exam.  Current complaints: None. Is tired but not affecting daily life, sleeps well despite restless legs (uses a heating pad and weighted blanket to calm)  No LMP recorded. (Menstrual status: IUD).  The pregnancy intention screening data noted above was reviewed. Potential methods of contraception were discussed. The patient elected to proceed with IUD or IUS.   Last pap 07/24/19. Results were: NILM w/ HRHPV negative. H/O abnormal pap: no Last mammogram: Never (age). Results were: N/A. Family h/o breast cancer: no Last colonoscopy: Never (age). Results were: N/A. Family h/o colorectal cancer: no     07/12/2022    8:47 AM 08/13/2020   11:12 AM 06/17/2020    3:43 PM 12/29/2019   10:49 AM 09/19/2019   10:48 AM  Depression screen PHQ 2/9  Decreased Interest 0 0 0 0 0  Down, Depressed, Hopeless 0 0 1 0 0  PHQ - 2 Score 0 0 1 0 0  Altered sleeping 2 1 1  0 0  Tired, decreased energy 2 1 1  0 1  Change in appetite 2 0 0 0 0  Feeling bad or failure about yourself  0 0 0 0 0  Trouble concentrating 0 0 0 0 0  Moving slowly or fidgety/restless 0 0 0 0 0  Suicidal thoughts 0 0 0 0 0  PHQ-9 Score 6 2 3  0 1        07/12/2022    8:47 AM 08/13/2020   11:13 AM 06/17/2020    3:43 PM 12/29/2019   10:49 AM  GAD 7 : Generalized Anxiety Score  Nervous, Anxious, on Edge 1 1 1 1   Control/stop worrying 0 0 1 0  Worry too much - different things 0 0 1 0  Trouble relaxing 1 1 0 0  Restless 0 1 0 0  Easily annoyed or irritable 1 1 1 1   Afraid - awful might happen 0 0 0 0  Total GAD 7 Score 3 4 4 2      Review of Systems:   Pertinent items are noted in HPI Denies any headaches, blurred vision, fatigue, shortness of breath, chest pain, abdominal  pain, abnormal vaginal discharge/itching/odor/irritation, problems with periods, bowel movements, urination, or intercourse unless otherwise stated above. Pertinent History Reviewed:  Reviewed past medical,surgical, social and family history.  Reviewed problem list, medications and allergies. Physical Assessment:   Vitals:   07/12/22 0837  BP: 125/68  Pulse: 81  Weight: 183 lb (83 kg)  Height: 5\' 7"  (1.702 m)   Body mass index is 28.66 kg/m.   Physical Examination:  General appearance - well appearing, and in no distress Mental status - alert, oriented to person, place, and time Psych:  She has a normal mood and affect Skin - warm and dry, normal color, no suspicious lesions noted Chest - effort normal, no problems with respiration noted Heart - normal rate and regular rhythm, warm and well perfused Neck:  midline trachea, no thyromegaly Breasts - breasts appear normal Abdomen - soft, nontender, nondistended, no masses or organomegaly Pelvic - VULVA: normal appearing vulva with no masses, tenderness or lesions   VAGINA: normal appearing vagina with normal color and discharge, no lesions   CERVIX: normal appearing cervix without discharge or lesions,  no CMT Thin prep pap is done with HR HPV cotesting Extremities:  No swelling or varicosities noted  Chaperone present for exam  No results found for this or any previous visit (from the past 24 hour(s)).  Assessment & Plan:  1. Encounter for annual routine gynecological examination - Will follow up results of pap smear and manage accordingly. - Routine preventative health maintenance measures emphasized. - Mammogram: @ 27yo, or sooner if problems - Colonoscopy: @ 27yo, or sooner if problems  2. Papanicolaou smear for cervical cancer screening - Cytology - PAP( [place])  3. Screening for STD (sexually transmitted disease) - HIV antibody (with reflex) - RPR - Hepatitis C Antibody - Hepatitis B Surface AntiGEN  4.  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 IUD in place - Has Mirena in place, reassured that having no period does not cause hyperplasia and discussed the protective benefits of not cycling.  Orders Placed This Encounter  Procedures   HIV antibody (with reflex)   RPR   Hepatitis C Antibody   Hepatitis B Surface AntiGEN   Meds: No orders of the defined types were placed in this encounter.  Follow-up: Return in about 1 year (around 07/12/2023) for ANN.  Bernerd Limbo, CNM 07/12/2022 12:28 PM

## 2022-07-13 LAB — CERVICOVAGINAL ANCILLARY ONLY
Chlamydia: NEGATIVE
Comment: NEGATIVE
Comment: NEGATIVE
Comment: NORMAL
Neisseria Gonorrhea: NEGATIVE
Trichomonas: NEGATIVE

## 2022-07-15 LAB — HIV 1/2 AB DIFFERENTIATION
HIV 1 Ab: NONREACTIVE
HIV 2 Ab: NONREACTIVE
NOTE (HIV CONF MULTIP: NEGATIVE

## 2022-07-15 LAB — HIV-1/HIV-2 QUALITATIVE RNA
Final Interpretation: NEGATIVE
HIV-1 RNA, Qualitative: NONREACTIVE
HIV-2 RNA, Qualitative: NONREACTIVE

## 2022-07-15 LAB — HEPATITIS B SURFACE ANTIGEN: Hepatitis B Surface Ag: NEGATIVE

## 2022-07-15 LAB — HIV ANTIBODY (ROUTINE TESTING W REFLEX): HIV Screen 4th Generation wRfx: REACTIVE

## 2022-07-15 LAB — RPR: RPR Ser Ql: NONREACTIVE

## 2022-07-15 LAB — HEPATITIS C ANTIBODY: Hep C Virus Ab: NONREACTIVE

## 2022-07-18 LAB — CYTOLOGY - PAP
Adequacy: ABSENT
Chlamydia: NEGATIVE
Comment: NEGATIVE
Comment: NEGATIVE
Comment: NORMAL
Diagnosis: NEGATIVE
Neisseria Gonorrhea: NEGATIVE
Trichomonas: NEGATIVE

## 2022-10-31 ENCOUNTER — Ambulatory Visit: Payer: Self-pay

## 2023-05-23 ENCOUNTER — Ambulatory Visit: Payer: MEDICAID | Admitting: Certified Nurse Midwife

## 2023-05-29 NOTE — Progress Notes (Signed)
    GYNECOLOGY OFFICE PROCEDURE NOTE  Christine Ramsey is a 28 y.o. G1P1001 here for removal and reinsertion of Liletta IUD. No GYN concerns.  Last pap smear was on 07/12/22 and was normal.  BP 127/85   Pulse 85   Wt 169 lb 1.6 oz (76.7 kg)   BMI 26.48 kg/m    IUD Removal & Reinsertion Procedure Note Patient identified, informed consent performed, consent signed. Discussed risks of irregular bleeding, cramping, infection, malpositioning or misplacement of the IUD outside the uterus which may require further procedure such as laparoscopy. Also discussed >99% contraception efficacy, increased risk of ectopic pregnancy with failure of method.   Emphasized that this did not protect against STIs, condoms recommended during all sexual encounters. Time out was performed.  Chaperone present.  Urine pregnancy test negative.  Patient was in the dorsal lithotomy position, normal external genitalia was noted. Speculum placed in the vagina. The multiparous cervix was visualized, no lesions, no abnormal discharge. Swabs collected. The strings of the IUD were grasped and pulled using ring forceps. The IUD was removed in its entirety.   Cleaned with Betadine x 2. Grasped anteriorly with a single tooth tenaculum. IUD placed per manufacturer's recommendations.  Strings trimmed to 3 cm. Tenaculum was removed, good hemostasis noted.  Patient tolerated procedure well.   Patient was given post-procedure instructions.  She was advised to have backup contraception for one week.  Patient was also asked to check IUD strings periodically and follow up in 4 weeks for IUD check.  1. Encounter for removal and reinsertion of intrauterine contraceptive device (IUD) (Primary) - ibuprofen (ADVIL) tablet 800 mg - levonorgestrel (LILETTA) 20.1 MCG/DAY IUD 1 each  2. Screening examination for STD (sexually transmitted disease) - Cervicovaginal ancillary only( [place]) - RPR+HBsAg+HCVAb+...   Follow up PRN, string check in  81mo.  Edd Arbour, CNM, MSN, IBCLC Certified Nurse Midwife, [facility]

## 2023-05-30 ENCOUNTER — Ambulatory Visit (INDEPENDENT_AMBULATORY_CARE_PROVIDER_SITE_OTHER): Payer: MEDICAID | Admitting: Certified Nurse Midwife

## 2023-05-30 ENCOUNTER — Other Ambulatory Visit (HOSPITAL_COMMUNITY)
Admission: RE | Admit: 2023-05-30 | Discharge: 2023-05-30 | Disposition: A | Discharge status: Home / Self Care | Payer: BC Managed Care – PPO | Source: Ambulatory Visit | Attending: Certified Nurse Midwife | Admitting: Certified Nurse Midwife

## 2023-05-30 ENCOUNTER — Encounter: Payer: Self-pay | Admitting: Certified Nurse Midwife

## 2023-05-30 ENCOUNTER — Other Ambulatory Visit: Payer: Self-pay

## 2023-05-30 VITALS — BP 127/85 | HR 85 | Wt 169.1 lb

## 2023-05-30 DIAGNOSIS — Z30433 Encounter for removal and reinsertion of intrauterine contraceptive device: Secondary | ICD-10-CM

## 2023-05-30 DIAGNOSIS — Z113 Encounter for screening for infections with a predominantly sexual mode of transmission: Secondary | ICD-10-CM | POA: Diagnosis present

## 2023-05-30 DIAGNOSIS — Z1331 Encounter for screening for depression: Secondary | ICD-10-CM

## 2023-05-30 MED ORDER — IBUPROFEN 800 MG PO TABS
800.0000 mg | ORAL_TABLET | Freq: Once | ORAL | Status: AC
Start: 2023-05-30 — End: 2023-05-30
  Administered 2023-05-30: 800 mg via ORAL

## 2023-05-30 MED ORDER — LEVONORGESTREL 20.1 MCG/DAY IU IUD
1.0000 | INTRAUTERINE_SYSTEM | Freq: Once | INTRAUTERINE | Status: AC
Start: 2023-05-30 — End: 2023-05-30
  Administered 2023-05-30: 1 via INTRAUTERINE

## 2023-06-01 LAB — CERVICOVAGINAL ANCILLARY ONLY
Chlamydia: NEGATIVE
Comment: NEGATIVE
Comment: NEGATIVE
Comment: NORMAL
Neisseria Gonorrhea: NEGATIVE
Trichomonas: NEGATIVE

## 2023-06-02 LAB — HIV 1/2 AB DIFFERENTIATION
HIV 1 Ab: NONREACTIVE
HIV 2 Ab: NONREACTIVE
NOTE (HIV CONF MULTIP: NEGATIVE

## 2023-06-02 LAB — RPR+HBSAG+HCVAB+...
HIV Screen 4th Generation wRfx: REACTIVE
Hep C Virus Ab: NONREACTIVE
Hepatitis B Surface Ag: NEGATIVE
RPR Ser Ql: NONREACTIVE

## 2023-06-02 LAB — HIV-1/HIV-2 QUALITATIVE RNA
Final Interpretation: NEGATIVE
HIV-1 RNA, Qualitative: NONREACTIVE
HIV-2 RNA, Qualitative: NONREACTIVE

## 2023-06-03 ENCOUNTER — Encounter: Payer: Self-pay | Admitting: Certified Nurse Midwife

## 2023-06-27 ENCOUNTER — Ambulatory Visit: Payer: MEDICAID | Admitting: Certified Nurse Midwife
